# Patient Record
Sex: Male | Born: 1935 | Race: White | Hispanic: No | Marital: Married | Smoking: Never smoker
Health system: Southern US, Community
[De-identification: ages and names within clinical notes are randomized; demographics above are authoritative.]

## PROBLEM LIST (undated history)

## (undated) DIAGNOSIS — G4761 Periodic limb movement disorder: Secondary | ICD-10-CM

## (undated) DIAGNOSIS — I499 Cardiac arrhythmia, unspecified: Secondary | ICD-10-CM

## (undated) DIAGNOSIS — I10 Essential (primary) hypertension: Secondary | ICD-10-CM

## (undated) DIAGNOSIS — K219 Gastro-esophageal reflux disease without esophagitis: Secondary | ICD-10-CM

## (undated) DIAGNOSIS — G4730 Sleep apnea, unspecified: Secondary | ICD-10-CM

## (undated) DIAGNOSIS — T8859XA Other complications of anesthesia, initial encounter: Secondary | ICD-10-CM

## (undated) DIAGNOSIS — C801 Malignant (primary) neoplasm, unspecified: Secondary | ICD-10-CM

## (undated) DIAGNOSIS — T4145XA Adverse effect of unspecified anesthetic, initial encounter: Secondary | ICD-10-CM

## (undated) HISTORY — PX: PROSTATECTOMY: SHX69

## (undated) HISTORY — PX: FINGER SURGERY: SHX640

## (undated) HISTORY — PX: APPENDECTOMY: SHX54

## (undated) HISTORY — PX: CARPAL TUNNEL RELEASE: SHX101

## (undated) HISTORY — PX: COLONOSCOPY: SHX174

## (undated) HISTORY — PX: CHOLECYSTECTOMY: SHX55

## (undated) HISTORY — PX: BOWEL RESECTION: SHX1257

## (undated) HISTORY — PX: EYE SURGERY: SHX253

## (undated) HISTORY — PX: TONSILLECTOMY: SUR1361

---

## 2014-04-17 ENCOUNTER — Other Ambulatory Visit: Payer: Self-pay | Admitting: Neurosurgery

## 2014-04-17 ENCOUNTER — Encounter (HOSPITAL_COMMUNITY): Payer: Self-pay | Admitting: Pharmacy Technician

## 2014-04-20 ENCOUNTER — Inpatient Hospital Stay (HOSPITAL_COMMUNITY): Payer: Medicare Other

## 2014-04-20 ENCOUNTER — Encounter (HOSPITAL_COMMUNITY)
Admission: AD | Disposition: A | Discharge status: Home / Self Care | Payer: Self-pay | Source: Ambulatory Visit | Attending: Neurosurgery

## 2014-04-20 ENCOUNTER — Inpatient Hospital Stay (HOSPITAL_COMMUNITY)
Admission: AD | Admit: 2014-04-20 | Discharge: 2014-04-21 | DRG: 520 | Disposition: A | Discharge status: Home / Self Care | Payer: Medicare Other | Source: Ambulatory Visit | Attending: Neurosurgery | Admitting: Neurosurgery

## 2014-04-20 ENCOUNTER — Inpatient Hospital Stay (HOSPITAL_COMMUNITY): Payer: Medicare Other | Admitting: Certified Registered Nurse Anesthetist

## 2014-04-20 ENCOUNTER — Encounter (HOSPITAL_COMMUNITY): Payer: Medicare Other | Admitting: Certified Registered Nurse Anesthetist

## 2014-04-20 ENCOUNTER — Encounter (HOSPITAL_COMMUNITY): Payer: Self-pay | Admitting: *Deleted

## 2014-04-20 DIAGNOSIS — K219 Gastro-esophageal reflux disease without esophagitis: Secondary | ICD-10-CM | POA: Diagnosis present

## 2014-04-20 DIAGNOSIS — M5126 Other intervertebral disc displacement, lumbar region: Secondary | ICD-10-CM | POA: Diagnosis present

## 2014-04-20 DIAGNOSIS — I10 Essential (primary) hypertension: Secondary | ICD-10-CM | POA: Diagnosis present

## 2014-04-20 DIAGNOSIS — Z885 Allergy status to narcotic agent status: Secondary | ICD-10-CM | POA: Diagnosis not present

## 2014-04-20 DIAGNOSIS — G4730 Sleep apnea, unspecified: Secondary | ICD-10-CM | POA: Diagnosis present

## 2014-04-20 HISTORY — DX: Essential (primary) hypertension: I10

## 2014-04-20 HISTORY — DX: Malignant (primary) neoplasm, unspecified: C80.1

## 2014-04-20 HISTORY — DX: Cardiac arrhythmia, unspecified: I49.9

## 2014-04-20 HISTORY — PX: LUMBAR LAMINECTOMY/DECOMPRESSION MICRODISCECTOMY: SHX5026

## 2014-04-20 HISTORY — DX: Sleep apnea, unspecified: G47.30

## 2014-04-20 HISTORY — DX: Gastro-esophageal reflux disease without esophagitis: K21.9

## 2014-04-20 HISTORY — DX: Adverse effect of unspecified anesthetic, initial encounter: T41.45XA

## 2014-04-20 HISTORY — DX: Other complications of anesthesia, initial encounter: T88.59XA

## 2014-04-20 LAB — BASIC METABOLIC PANEL
Anion gap: 12 (ref 5–15)
BUN: 34 mg/dL — ABNORMAL HIGH (ref 6–23)
CO2: 25 meq/L (ref 19–32)
Calcium: 8.9 mg/dL (ref 8.4–10.5)
Chloride: 98 mEq/L (ref 96–112)
Creatinine, Ser: 1.2 mg/dL (ref 0.50–1.35)
GFR calc Af Amer: 66 mL/min — ABNORMAL LOW (ref >90)
GFR calc non Af Amer: 57 mL/min — ABNORMAL LOW (ref >90)
GLUCOSE: 98 mg/dL (ref 70–99)
Potassium: 4.2 mEq/L (ref 3.7–5.3)
Sodium: 135 mEq/L — ABNORMAL LOW (ref 137–147)

## 2014-04-20 LAB — CBC
HEMATOCRIT: 41.5 % (ref 39.0–52.0)
HEMOGLOBIN: 14.1 g/dL (ref 13.0–17.0)
MCH: 30.5 pg (ref 26.0–34.0)
MCHC: 34 g/dL (ref 30.0–36.0)
MCV: 89.8 fL (ref 78.0–100.0)
Platelets: 187 10*3/uL (ref 150–400)
RBC: 4.62 MIL/uL (ref 4.22–5.81)
RDW: 13.5 % (ref 11.5–15.5)
WBC: 12 10*3/uL — AB (ref 4.0–10.5)

## 2014-04-20 LAB — SURGICAL PCR SCREEN
MRSA, PCR: NEGATIVE
Staphylococcus aureus: NEGATIVE

## 2014-04-20 SURGERY — LUMBAR LAMINECTOMY/DECOMPRESSION MICRODISCECTOMY 1 LEVEL
Anesthesia: General | Laterality: Right

## 2014-04-20 MED ORDER — MENTHOL 3 MG MT LOZG: 1.0000 | LOZENGE | OROMUCOSAL | Status: DC | PRN

## 2014-04-20 MED ORDER — FENTANYL CITRATE 0.05 MG/ML IJ SOLN
INTRAMUSCULAR | Status: DC | PRN
  Administered 2014-04-20 (×5): 50 ug via INTRAVENOUS

## 2014-04-20 MED ORDER — MUPIROCIN 2 % EX OINT: 1.0000 "application " | TOPICAL_OINTMENT | Freq: Once | CUTANEOUS | Status: DC

## 2014-04-20 MED ORDER — ONDANSETRON HCL 4 MG/2ML IJ SOLN
INTRAMUSCULAR | Status: AC
  Filled 2014-04-20: qty 2

## 2014-04-20 MED ORDER — SODIUM CHLORIDE 0.9 % IJ SOLN: 3.0000 mL | INTRAMUSCULAR | Status: DC | PRN

## 2014-04-20 MED ORDER — MORPHINE SULFATE 2 MG/ML IJ SOLN
1.0000 mg | INTRAMUSCULAR | Status: DC | PRN
  Administered 2014-04-20: 2 mg via INTRAVENOUS

## 2014-04-20 MED ORDER — DIAZEPAM 5 MG PO TABS
5.0000 mg | ORAL_TABLET | Freq: Four times a day (QID) | ORAL | Status: DC | PRN
  Administered 2014-04-20: 5 mg via ORAL
  Filled 2014-04-20: qty 1

## 2014-04-20 MED ORDER — DEXAMETHASONE 4 MG PO TABS
4.0000 mg | ORAL_TABLET | Freq: Four times a day (QID) | ORAL | Status: DC
  Administered 2014-04-21: 4 mg via ORAL
  Filled 2014-04-20: qty 1

## 2014-04-20 MED ORDER — OXYCODONE HCL 5 MG/5ML PO SOLN: 5.0000 mg | Freq: Once | ORAL | Status: DC | PRN

## 2014-04-20 MED ORDER — HYDROMORPHONE HCL PF 1 MG/ML IJ SOLN: 0.2500 mg | INTRAMUSCULAR | Status: DC | PRN

## 2014-04-20 MED ORDER — MIDAZOLAM HCL 2 MG/2ML IJ SOLN
INTRAMUSCULAR | Status: AC
  Filled 2014-04-20: qty 2

## 2014-04-20 MED ORDER — SODIUM CHLORIDE 0.9 % IV SOLN: 250.0000 mL | INTRAVENOUS | Status: DC

## 2014-04-20 MED ORDER — OXYCODONE HCL 5 MG PO TABS: 5.0000 mg | ORAL_TABLET | Freq: Once | ORAL | Status: DC | PRN

## 2014-04-20 MED ORDER — MUPIROCIN 2 % EX OINT
TOPICAL_OINTMENT | CUTANEOUS | Status: AC
  Administered 2014-04-20: 12:00:00
  Filled 2014-04-20: qty 22

## 2014-04-20 MED ORDER — THROMBIN 5000 UNITS EX SOLR
CUTANEOUS | Status: DC | PRN
  Administered 2014-04-20 (×2): 5000 [IU] via TOPICAL

## 2014-04-20 MED ORDER — EPHEDRINE SULFATE 50 MG/ML IJ SOLN
INTRAMUSCULAR | Status: DC | PRN
  Administered 2014-04-20: 10 mg via INTRAVENOUS
  Administered 2014-04-20: 5 mg via INTRAVENOUS
  Administered 2014-04-20 (×4): 10 mg via INTRAVENOUS

## 2014-04-20 MED ORDER — GABAPENTIN 300 MG PO CAPS
300.0000 mg | ORAL_CAPSULE | Freq: Three times a day (TID) | ORAL | Status: DC
  Administered 2014-04-20 – 2014-04-21 (×2): 300 mg via ORAL
  Filled 2014-04-20 (×3): qty 1

## 2014-04-20 MED ORDER — ONDANSETRON HCL 4 MG/2ML IJ SOLN: 4.0000 mg | INTRAMUSCULAR | Status: DC | PRN

## 2014-04-20 MED ORDER — LISINOPRIL 40 MG PO TABS
40.0000 mg | ORAL_TABLET | Freq: Every day | ORAL | Status: DC
  Administered 2014-04-20: 40 mg via ORAL
  Filled 2014-04-20: qty 1
  Filled 2014-04-20: qty 2
  Filled 2014-04-20: qty 1

## 2014-04-20 MED ORDER — HEMOSTATIC AGENTS (NO CHARGE) OPTIME
TOPICAL | Status: DC | PRN
  Administered 2014-04-20: 1 via TOPICAL

## 2014-04-20 MED ORDER — OXYCODONE-ACETAMINOPHEN 5-325 MG PO TABS: 1.0000 | ORAL_TABLET | ORAL | Status: DC | PRN

## 2014-04-20 MED ORDER — NEOSTIGMINE METHYLSULFATE 10 MG/10ML IV SOLN
INTRAVENOUS | Status: AC
  Filled 2014-04-20: qty 1

## 2014-04-20 MED ORDER — LACTATED RINGERS IV SOLN
INTRAVENOUS | Status: DC | PRN
  Administered 2014-04-20 (×2): via INTRAVENOUS

## 2014-04-20 MED ORDER — CEFAZOLIN SODIUM-DEXTROSE 2-3 GM-% IV SOLR
INTRAVENOUS | Status: DC | PRN
  Administered 2014-04-20: 2 g via INTRAVENOUS

## 2014-04-20 MED ORDER — BUPIVACAINE-EPINEPHRINE (PF) 0.5% -1:200000 IJ SOLN
INTRAMUSCULAR | Status: DC | PRN
  Administered 2014-04-20: 20 mL via PERINEURAL

## 2014-04-20 MED ORDER — PHENOL 1.4 % MT LIQD: 1.0000 | OROMUCOSAL | Status: DC | PRN

## 2014-04-20 MED ORDER — CEFAZOLIN SODIUM 1-5 GM-% IV SOLN
1.0000 g | Freq: Three times a day (TID) | INTRAVENOUS | Status: AC
  Administered 2014-04-21 (×2): 1 g via INTRAVENOUS
  Filled 2014-04-20 (×2): qty 50

## 2014-04-20 MED ORDER — PHENYLEPHRINE HCL 10 MG/ML IJ SOLN
INTRAMUSCULAR | Status: DC | PRN
  Administered 2014-04-20 (×4): 40 ug via INTRAVENOUS

## 2014-04-20 MED ORDER — FENTANYL CITRATE 0.05 MG/ML IJ SOLN
INTRAMUSCULAR | Status: AC
  Filled 2014-04-20: qty 5

## 2014-04-20 MED ORDER — MIDAZOLAM HCL 5 MG/5ML IJ SOLN
INTRAMUSCULAR | Status: DC | PRN
  Administered 2014-04-20 (×2): 1 mg via INTRAVENOUS

## 2014-04-20 MED ORDER — 0.9 % SODIUM CHLORIDE (POUR BTL) OPTIME
TOPICAL | Status: DC | PRN
  Administered 2014-04-20: 1000 mL

## 2014-04-20 MED ORDER — BUPIVACAINE LIPOSOME 1.3 % IJ SUSP
INTRAMUSCULAR | Status: DC | PRN
  Administered 2014-04-20: 20 mL

## 2014-04-20 MED ORDER — PROPOFOL 10 MG/ML IV BOLUS
INTRAVENOUS | Status: AC
  Filled 2014-04-20: qty 20

## 2014-04-20 MED ORDER — BUPIVACAINE LIPOSOME 1.3 % IJ SUSP
20.0000 mL | Freq: Once | INTRAMUSCULAR | Status: DC
  Filled 2014-04-20: qty 20

## 2014-04-20 MED ORDER — HYDROCHLOROTHIAZIDE 25 MG PO TABS
12.5000 mg | ORAL_TABLET | Freq: Every day | ORAL | Status: DC
  Administered 2014-04-20: 12.5 mg via ORAL
  Filled 2014-04-20: qty 1

## 2014-04-20 MED ORDER — ACETAMINOPHEN 650 MG RE SUPP: 650.0000 mg | RECTAL | Status: DC | PRN

## 2014-04-20 MED ORDER — DEXAMETHASONE SODIUM PHOSPHATE 4 MG/ML IJ SOLN
INTRAMUSCULAR | Status: AC
  Filled 2014-04-20: qty 3

## 2014-04-20 MED ORDER — NEOSTIGMINE METHYLSULFATE 10 MG/10ML IV SOLN
INTRAVENOUS | Status: DC | PRN
  Administered 2014-04-20: 4 mg via INTRAVENOUS

## 2014-04-20 MED ORDER — ZOLPIDEM TARTRATE 5 MG PO TABS
5.0000 mg | ORAL_TABLET | Freq: Every evening | ORAL | Status: DC | PRN
  Administered 2014-04-21: 5 mg via ORAL
  Filled 2014-04-20: qty 1

## 2014-04-20 MED ORDER — SODIUM CHLORIDE 0.9 % IV SOLN
INTRAVENOUS | Status: DC
  Administered 2014-04-20: 1000 mL via INTRAVENOUS

## 2014-04-20 MED ORDER — LIDOCAINE HCL (CARDIAC) 20 MG/ML IV SOLN
INTRAVENOUS | Status: AC
  Filled 2014-04-20: qty 5

## 2014-04-20 MED ORDER — ROCURONIUM BROMIDE 50 MG/5ML IV SOLN
INTRAVENOUS | Status: AC
  Filled 2014-04-20: qty 1

## 2014-04-20 MED ORDER — ROCURONIUM BROMIDE 100 MG/10ML IV SOLN
INTRAVENOUS | Status: DC | PRN
  Administered 2014-04-20: 50 mg via INTRAVENOUS

## 2014-04-20 MED ORDER — FENTANYL CITRATE 0.05 MG/ML IJ SOLN
INTRAMUSCULAR | Status: DC | PRN
  Administered 2014-04-20: 100 ug via INTRAVENOUS

## 2014-04-20 MED ORDER — SODIUM CHLORIDE 0.9 % IJ SOLN: 3.0000 mL | Freq: Two times a day (BID) | INTRAMUSCULAR | Status: DC

## 2014-04-20 MED ORDER — DILTIAZEM HCL ER 180 MG PO CP24
180.0000 mg | ORAL_CAPSULE | Freq: Every day | ORAL | Status: DC
  Administered 2014-04-20: 180 mg via ORAL
  Filled 2014-04-20 (×3): qty 1

## 2014-04-20 MED ORDER — LIDOCAINE HCL (CARDIAC) 20 MG/ML IV SOLN
INTRAVENOUS | Status: DC | PRN
  Administered 2014-04-20: 50 mg via INTRAVENOUS

## 2014-04-20 MED ORDER — GLYCOPYRROLATE 0.2 MG/ML IJ SOLN
INTRAMUSCULAR | Status: AC
  Filled 2014-04-20: qty 3

## 2014-04-20 MED ORDER — ACETAMINOPHEN 325 MG PO TABS: 650.0000 mg | ORAL_TABLET | ORAL | Status: DC | PRN

## 2014-04-20 MED ORDER — FENTANYL CITRATE 0.05 MG/ML IJ SOLN
INTRAMUSCULAR | Status: AC
  Filled 2014-04-20: qty 2

## 2014-04-20 MED ORDER — PROPOFOL 10 MG/ML IV BOLUS
INTRAVENOUS | Status: DC | PRN
  Administered 2014-04-20: 120 mg via INTRAVENOUS
  Administered 2014-04-20 (×4): 20 mg via INTRAVENOUS

## 2014-04-20 MED ORDER — METHYLPREDNISOLONE ACETATE 80 MG/ML IJ SUSP
INTRAMUSCULAR | Status: DC | PRN
  Administered 2014-04-20: 80 mg

## 2014-04-20 MED ORDER — MORPHINE SULFATE 2 MG/ML IJ SOLN
INTRAMUSCULAR | Status: AC
  Filled 2014-04-20: qty 1

## 2014-04-20 MED ORDER — GLYCOPYRROLATE 0.2 MG/ML IJ SOLN
INTRAMUSCULAR | Status: DC | PRN
Start: 2014-04-20 — End: 2014-04-20
  Administered 2014-04-20: 0.2 mg via INTRAVENOUS
  Administered 2014-04-20: 0.6 mg via INTRAVENOUS

## 2014-04-20 MED ORDER — DEXAMETHASONE SODIUM PHOSPHATE 4 MG/ML IJ SOLN
4.0000 mg | Freq: Four times a day (QID) | INTRAMUSCULAR | Status: DC
  Administered 2014-04-20 – 2014-04-21 (×3): 4 mg via INTRAVENOUS
  Filled 2014-04-20 (×3): qty 1

## 2014-04-20 MED ORDER — ONDANSETRON HCL 4 MG/2ML IJ SOLN
INTRAMUSCULAR | Status: DC | PRN
  Administered 2014-04-20: 4 mg via INTRAVENOUS

## 2014-04-20 MED ORDER — DEXAMETHASONE SODIUM PHOSPHATE 4 MG/ML IJ SOLN
INTRAMUSCULAR | Status: DC | PRN
  Administered 2014-04-20: 10 mg via INTRAVENOUS

## 2014-04-20 MED ORDER — LACTATED RINGERS IV SOLN
INTRAVENOUS | Status: DC
  Administered 2014-04-20: 13:00:00 via INTRAVENOUS

## 2014-04-20 MED ORDER — ONDANSETRON HCL 4 MG/2ML IJ SOLN: 4.0000 mg | Freq: Four times a day (QID) | INTRAMUSCULAR | Status: DC | PRN

## 2014-04-20 SURGICAL SUPPLY — 58 items
BENZOIN TINCTURE PRP APPL 2/3 (GAUZE/BANDAGES/DRESSINGS) ×3 IMPLANT
BLADE SURG ROTATE 9660 (MISCELLANEOUS) IMPLANT
BUR ACORN 6.0 (BURR) ×2 IMPLANT
BUR ACORN 6.0MM (BURR) ×1
BUR MATCHSTICK NEURO 3.0 LAGG (BURR) ×3 IMPLANT
CANISTER SUCT 3000ML (MISCELLANEOUS) ×3 IMPLANT
CLOSURE WOUND 1/2 X4 (GAUZE/BANDAGES/DRESSINGS) ×1
CONT SPEC 4OZ CLIKSEAL STRL BL (MISCELLANEOUS) ×3 IMPLANT
DRAPE LAPAROTOMY 100X72X124 (DRAPES) ×3 IMPLANT
DRAPE MICROSCOPE LEICA (MISCELLANEOUS) ×3 IMPLANT
DRAPE POUCH INSTRU U-SHP 10X18 (DRAPES) ×3 IMPLANT
DRSG OPSITE POSTOP 4X6 (GAUZE/BANDAGES/DRESSINGS) ×6 IMPLANT
DURAPREP 26ML APPLICATOR (WOUND CARE) ×3 IMPLANT
ELECT BLADE 4.0 EZ CLEAN MEGAD (MISCELLANEOUS) ×3
ELECT REM PT RETURN 9FT ADLT (ELECTROSURGICAL) ×3
ELECTRODE BLDE 4.0 EZ CLN MEGD (MISCELLANEOUS) ×1 IMPLANT
ELECTRODE REM PT RTRN 9FT ADLT (ELECTROSURGICAL) ×1 IMPLANT
GAUZE SPONGE 4X4 16PLY XRAY LF (GAUZE/BANDAGES/DRESSINGS) IMPLANT
GLOVE BIOGEL M 8.0 STRL (GLOVE) ×3 IMPLANT
GLOVE BIOGEL PI IND STRL 7.5 (GLOVE) ×2 IMPLANT
GLOVE BIOGEL PI INDICATOR 7.5 (GLOVE) ×4
GLOVE ECLIPSE 6.5 STRL STRAW (GLOVE) ×3 IMPLANT
GLOVE ECLIPSE 7.5 STRL STRAW (GLOVE) ×9 IMPLANT
GLOVE EXAM NITRILE LRG STRL (GLOVE) IMPLANT
GLOVE EXAM NITRILE MD LF STRL (GLOVE) IMPLANT
GLOVE EXAM NITRILE XL STR (GLOVE) IMPLANT
GLOVE EXAM NITRILE XS STR PU (GLOVE) IMPLANT
GOWN STRL REUS W/ TWL LRG LVL3 (GOWN DISPOSABLE) ×2 IMPLANT
GOWN STRL REUS W/ TWL XL LVL3 (GOWN DISPOSABLE) ×2 IMPLANT
GOWN STRL REUS W/TWL 2XL LVL3 (GOWN DISPOSABLE) IMPLANT
GOWN STRL REUS W/TWL LRG LVL3 (GOWN DISPOSABLE) ×4
GOWN STRL REUS W/TWL XL LVL3 (GOWN DISPOSABLE) ×4
KIT BASIN OR (CUSTOM PROCEDURE TRAY) ×3 IMPLANT
KIT ROOM TURNOVER OR (KITS) ×3 IMPLANT
NEEDLE HYPO 18GX1.5 BLUNT FILL (NEEDLE) ×3 IMPLANT
NEEDLE HYPO 21X1.5 SAFETY (NEEDLE) ×3 IMPLANT
NEEDLE HYPO 25X1 1.5 SAFETY (NEEDLE) ×3 IMPLANT
NEEDLE SPNL 20GX3.5 QUINCKE YW (NEEDLE) ×3 IMPLANT
NS IRRIG 1000ML POUR BTL (IV SOLUTION) ×3 IMPLANT
PACK LAMINECTOMY NEURO (CUSTOM PROCEDURE TRAY) ×3 IMPLANT
PAD ABD 8X10 STRL (GAUZE/BANDAGES/DRESSINGS) IMPLANT
PAD ARMBOARD 7.5X6 YLW CONV (MISCELLANEOUS) ×9 IMPLANT
PATTIES SURGICAL .5 X1 (DISPOSABLE) IMPLANT
RUBBERBAND STERILE (MISCELLANEOUS) ×6 IMPLANT
SPONGE GAUZE 4X4 12PLY (GAUZE/BANDAGES/DRESSINGS) ×3 IMPLANT
SPONGE LAP 4X18 X RAY DECT (DISPOSABLE) IMPLANT
SPONGE SURGIFOAM ABS GEL SZ50 (HEMOSTASIS) ×3 IMPLANT
STRIP CLOSURE SKIN 1/2X4 (GAUZE/BANDAGES/DRESSINGS) ×2 IMPLANT
SUT VIC AB 0 CT1 18XCR BRD8 (SUTURE) ×1 IMPLANT
SUT VIC AB 0 CT1 8-18 (SUTURE) ×2
SUT VIC AB 2-0 CP2 18 (SUTURE) ×3 IMPLANT
SUT VIC AB 3-0 SH 8-18 (SUTURE) ×3 IMPLANT
SYR 20CC LL (SYRINGE) ×3 IMPLANT
SYR 20ML ECCENTRIC (SYRINGE) ×3 IMPLANT
SYR 5ML LL (SYRINGE) ×3 IMPLANT
TOWEL OR 17X24 6PK STRL BLUE (TOWEL DISPOSABLE) ×3 IMPLANT
TOWEL OR 17X26 10 PK STRL BLUE (TOWEL DISPOSABLE) ×3 IMPLANT
WATER STERILE IRR 1000ML POUR (IV SOLUTION) ×3 IMPLANT

## 2014-04-20 NOTE — Anesthesia Preprocedure Evaluation (Addendum)
Anesthesia Evaluation  Patient identified by MRN, date of birth, ID band Patient awake    Reviewed: Allergy & Precautions, H&P , NPO status , Patient's Chart, lab work & pertinent test results  History of Anesthesia Complications (+) PROLONGED EMERGENCE and history of anesthetic complications  Airway Mallampati: III TM Distance: >3 FB Neck ROM: Full    Dental  (+) Teeth Intact, Implants, Dental Advisory Given   Pulmonary sleep apnea ,          Cardiovascular hypertension, Pt. on medications + dysrhythmias     Neuro/Psych negative neurological ROS     GI/Hepatic Neg liver ROS, GERD-  ,  Endo/Other  negative endocrine ROS  Renal/GU negative Renal ROS     Musculoskeletal   Abdominal Normal abdominal exam  (+)   Peds  Hematology negative hematology ROS (+)   Anesthesia Other Findings   Reproductive/Obstetrics                          Anesthesia Physical Anesthesia Plan  ASA: II  Anesthesia Plan: General   Post-op Pain Management:    Induction: Intravenous  Airway Management Planned: Oral ETT  Additional Equipment:   Intra-op Plan:   Post-operative Plan: Extubation in OR  Informed Consent: I have reviewed the patients History and Physical, chart, labs and discussed the procedure including the risks, benefits and alternatives for the proposed anesthesia with the patient or authorized representative who has indicated his/her understanding and acceptance.   Dental advisory given  Plan Discussed with: Anesthesiologist, Surgeon and CRNA  Anesthesia Plan Comments:        Anesthesia Quick Evaluation

## 2014-04-20 NOTE — Transfer of Care (Signed)
Immediate Anesthesia Transfer of Care Note  Patient: Mark Briggs  Procedure(s) Performed: Procedure(s): Right L4-5 Extraforaminal Discectomy (Right)  Patient Location: PACU  Anesthesia Type:General  Level of Consciousness: patient cooperative and responds to stimulation  Airway & Oxygen Therapy: Patient Spontanous Breathing and Patient connected to nasal cannula oxygen  Post-op Assessment: Report given to PACU RN, Post -op Vital signs reviewed and stable and Patient moving all extremities X 4  Post vital signs: Reviewed and stable  Complications: No apparent anesthesia complications

## 2014-04-20 NOTE — Progress Notes (Signed)
Pt admitted to 4N25 from PACU.  A&O; VSS; Drsg stain marked; Pain 3/10; medicated.  Family at bedside.  Will continue to monitor.

## 2014-04-20 NOTE — H&P (Signed)
Mark Briggs is an 78 y.o. male.   Chief Complaint: right leg pain HPI: patient who came to my office with his wife ,using crutches, complaining of lumbar pain with radiation to the right leg, no better with conservative treatment. No left leg pain. He has had epidural injections with negative results.  Past medical history Past surgical history, prostatectomy, removal of small  intestine, cholecistectomy, tonsillectomy, catarat surgery No family history on file. Social History:  has no tobacco, alcohol, and drug history on file.  Allergies:  Allergies  Allergen Reactions  . Codeine Other (See Comments)    dizziness    No prescriptions prior to admission    No results found for this or any previous visit (from the past 48 hour(s)). No results found.  Review of Systems  Constitutional: Negative.   HENT:       Ringing in ears  Eyes: Negative.   Cardiovascular: Negative.   Gastrointestinal: Negative.   Musculoskeletal: Positive for back pain.  Skin: Negative.   Neurological: Positive for sensory change and focal weakness.  Endo/Heme/Allergies: Negative.   Psychiatric/Behavioral: Negative.    ringin in ears, leg pain, lumbar pain, irregular pulses.   There were no vitals taken for this visit. Physical Exam hent, nl. Neck, nl. Lungs, clear. Cv, nl. Abdomen,soft. Extremities, nl pulses. NEURO decrease os the size of the leg and thigh by 1 inche in comparison to the left. Absent knee reflex. Sensory nl but he has tingling in the left leg.mri shows extraforaminal hnp at l4-5 on the right. . ddd at l5-s1  Assessment/Plan Patient to go ahead with extraforaminal discectomy at l4-5. He eand his wife are aware of risks and benefits  Soloman Mckeithan M 04/20/2014, 11:05 AM

## 2014-04-21 NOTE — Progress Notes (Addendum)
Pt A&O x4; pt discharge education and instructions completed with pt and spouse at bedside. Both voices understanding and denies any questions. Pt IV removed; pt incision intact with no s/s infection noted. Pt discharge home with spouse to transport pt off to disposition. Pt awaiting on his home rolling walker to be delivered. Will continue to monitor pt quietly till he leaves the floor. Francis Gaines Valerya Maxton RN.

## 2014-04-21 NOTE — Discharge Summary (Signed)
Physician Discharge Summary  Patient ID: Mark Briggs MRN: [id number] DOB/AGE: 1935-11-20 78 y.o.  Admit date: 04/20/2014 Discharge date: 04/21/2014  Admission Diagnoses:right l4-5 hnp  Discharge Diagnoses:  Active Problems:   Lumbar herniated disc   Discharged Condition: no pain  Hospital Course: surgery  Consults: none  Significant Diagnostic Studies: mri  Treatments:right l4-5 extraforaminal discectomy  Discharge Exam: weakness as preop but not pain Disposition: home     Medication List    ASK your doctor about these medications       acetaminophen 500 MG tablet  Commonly known as:  TYLENOL  Take 500 mg by mouth 3 (three) times daily as needed (pain).     aspirin EC 81 MG tablet  Take 81 mg by mouth at bedtime.     diltiazem 180 MG 24 hr capsule  Commonly known as:  DILACOR XR  Take 180 mg by mouth at bedtime.     gabapentin 300 MG capsule  Commonly known as:  NEURONTIN  Take 600 mg by mouth at bedtime.     hydrochlorothiazide 12.5 MG tablet  Commonly known as:  HYDRODIURIL  Take 12.5 mg by mouth at bedtime.     ibuprofen 200 MG tablet  Commonly known as:  ADVIL,MOTRIN  Take 400 mg by mouth 3 (three) times daily as needed (pain).     lisinopril 40 MG tablet  Commonly known as:  PRINIVIL,ZESTRIL  Take 40 mg by mouth at bedtime.     zolpidem 10 MG tablet  Commonly known as:  AMBIEN  Take 5-10 mg by mouth at bedtime as needed for sleep.         Signed: Floyce Stakes 04/21/2014, 10:44 AM

## 2014-04-21 NOTE — Op Note (Signed)
Mark Briggs, Mark Briggs               ACCOUNT NO.:  [id number]  MEDICAL RECORD NO.:  [id number]  LOCATION:  [id number]                        FACILITY:  [facility]  PHYSICIAN:  Leeroy Cha, M.D.   DATE OF BIRTH:  Jan 19, 1936  DATE OF PROCEDURE:  04/20/2014 DATE OF DISCHARGE:                              OPERATIVE REPORT   PREOPERATIVE DIAGNOSIS:  Right L4-L5 extraforaminal herniated disk with atrophy of the right leg.  POSTOPERATIVE DIAGNOSIS:  Right L4-L5 extraforaminal herniated disk with atrophy of the right leg.  PROCEDURES:  Right L4-L5 extraforaminal diskectomy, removal of several fragments.  Decompression of the L4 nerve root.  Microscope.  SURGEON:  Leeroy Cha, M.D.  ASSISTANT:  Ashok Pall, M.D.  CLINICAL HISTORY:  The patient was seen by me in my office last Friday, complaining of back pain, worsened to the right leg.  Clinically, he has atrophy of the right quadriceps on the right leg.  He has failed conservative treatment.  MRI showed that he has extraforaminal disk at the level of L4-5.  Surgery was advised.  He and his wife knew the risk with the surgery including CSF leak, no improvement whatsoever, _burning_________ sensation of the nerve, needing further surgery.  PROCEDURE IN DETAIL:  The patient was taken to the OR, and after intubation, he was positioned in a prone manner.  The back was cleaned with DuraPrep.  Drapes were applied.  Midline incision from L4-L5 was made and muscle retracted all the way laterally until we were able to be close to the transverse process.  X-ray showed that we were right at the level of L4-L5.  Then, with the help of the microscope, we opened the intertransverse ligament.  Then, with the drill, we removed the lateral aspect of the facet.  What we found was that the L4 nerve root was quite attached to the area.  The patient had quite a bit of scar tissue. Dissection was carried down.  Finally, we were able to mobilize the nerve  laterally and right at the level of the axilla, there were at least 8 to 10 fragments of disk.  Decompression was done.  Hemostasis was achieved.  At the end, we had plenty of room for the nerve root. The area was irrigated.  Fentanyl and Depo-Medrol were left in the operative site and the wound was closed with Vicryl and Steri-Strips.          ______________________________ Leeroy Cha, M.D.     EB/MEDQ  D:  04/20/2014  T:  04/20/2014  Job:  [id number]

## 2014-04-21 NOTE — Anesthesia Postprocedure Evaluation (Signed)
Anesthesia Post Note  Patient: Mark Briggs  Procedure(s) Performed: Procedure(s) (LRB): Right L4-5 Extraforaminal Discectomy (Right)  Anesthesia type: General  Patient location: PACU  Post pain: Pain level controlled and Adequate analgesia  Post assessment: Post-op Vital signs reviewed, Patient's Cardiovascular Status Stable, Respiratory Function Stable, Patent Airway and Pain level controlled  Last Vitals:  Filed Vitals:   04/21/14 0552  BP: 98/54  Pulse: 78  Temp: 36.7 C  Resp: 18    Post vital signs: Reviewed and stable  Level of consciousness: awake, alert  and oriented  Complications: No apparent anesthesia complications

## 2014-04-21 NOTE — Discharge Summary (Signed)
Physician Discharge Summary  Patient ID: Mark Briggs MRN: [id number] DOB/AGE: 76-Feb-1937 76 y.o.  Admit date: 04/20/2014 Discharge date: 04/21/2014  Admission Diagnoses:lumbar disc  Discharge Diagnoses:  Active Problems:   Lumbar herniated disc   Discharged Condition:no pain    Hospital Course: surgery  Consults: none  Significant Diagnostic Studies:  mri  Treatments: lumbar discectomy  Discharge Exam: Blood pressure 121/49, pulse 85, temperature 98.8 F (37.1 C), temperature source Oral, resp. rate 18, height 5\' 11"  (1.803 m), weight 82.101 kg (181 lb), SpO2 98.00%. Walking with no pain  Disposition: home     Medication List    ASK your doctor about these medications       acetaminophen 500 MG tablet  Commonly known as:  TYLENOL  Take 500 mg by mouth 3 (three) times daily as needed (pain).     aspirin EC 81 MG tablet  Take 81 mg by mouth at bedtime.     diltiazem 180 MG 24 hr capsule  Commonly known as:  DILACOR XR  Take 180 mg by mouth at bedtime.     gabapentin 300 MG capsule  Commonly known as:  NEURONTIN  Take 600 mg by mouth at bedtime.     hydrochlorothiazide 12.5 MG tablet  Commonly known as:  HYDRODIURIL  Take 12.5 mg by mouth at bedtime.     ibuprofen 200 MG tablet  Commonly known as:  ADVIL,MOTRIN  Take 400 mg by mouth 3 (three) times daily as needed (pain).     lisinopril 40 MG tablet  Commonly known as:  PRINIVIL,ZESTRIL  Take 40 mg by mouth at bedtime.     zolpidem 10 MG tablet  Commonly known as:  AMBIEN  Take 5-10 mg by mouth at bedtime as needed for sleep.         Signed: Floyce Stakes 04/21/2014, 10:46 AM

## 2014-04-21 NOTE — Evaluation (Signed)
Physical Therapy Evaluation Patient Details Name: Trayon Krantz MRN: [id number] DOB: Jul 24, 1936 Today's Date: 04/21/2014   History of Present Illness  78 y.o. s/p Right L4-5 Extraforaminal Discectomy  Clinical Impression  Pt mobilizing very well with RW.  All education has been reinforced and completed.  No further PT needs.  Will sign off.    Follow Up Recommendations No PT follow up;Supervision for mobility/OOB    Equipment Recommendations  Rolling walker with 5" wheels    Recommendations for Other Services       Precautions / Restrictions Precautions Precautions: Back Precaution Booklet Issued: Yes (comment) Precaution Comments: Reviewed precautions with pt Restrictions Weight Bearing Restrictions: No      Mobility  Bed Mobility Overal bed mobility: Needs Assistance Bed Mobility: Rolling;Sidelying to Sit;Sit to Sidelying Rolling: Supervision Sidelying to sit: Supervision     Sit to sidelying: Supervision General bed mobility comments: cued/ demo'd for technique and practiced several times to get move proficient and safe  Transfers Overall transfer level: Needs assistance Equipment used: Rolling walker (2 wheeled) Transfers: Sit to/from [facility] Sit to Stand: Supervision Stand pivot transfers: Supervision       General transfer comment: cues for hand placement  Ambulation/Gait Ambulation/Gait assistance: Supervision Ambulation Distance (Feet): 250 Feet Assistive device: Rolling walker (2 wheeled);None Gait Pattern/deviations: Step-through pattern Gait velocity: prefers slower, but can increase speed to cue   General Gait Details: steady with RW, more guarded and antalgic without assistive device  Stairs Stairs: Yes Stairs assistance: Supervision Stair Management: One rail Right;Step to pattern;Alternating pattern;Forwards Number of Stairs: 7 General stair comments: steady with rail  Wheelchair Mobility    Modified Rankin (Stroke  Patients Only)       Balance Overall balance assessment: No apparent balance deficits (not formally assessed)                                           Pertinent Vitals/Pain R groin pain, managed by pain meds.    Home Living Family/patient expects to be discharged to:: Private residence Living Arrangements: Spouse/significant other Available Help at Discharge: Family;Available 24 hours/day Type of Home: House Home Access: Stairs to enter   [employer] of Steps: 3 Home Layout: One level;Other (Comment) Home Equipment: Cane - quad;Crutches (don't have 2 Wheeled RW like pt thought)      Prior Function Level of Independence: Independent               Hand Dominance   Dominant Hand: Right    Extremity/Trunk Assessment   Upper Extremity Assessment: Overall WFL for tasks assessed           Lower Extremity Assessment: Overall WFL for tasks assessed         Communication   Communication: No difficulties  Cognition Arousal/Alertness: Awake/alert Behavior During Therapy: WFL for tasks assessed/performed Overall Cognitive Status: Within Functional Limits for tasks assessed                      General Comments General comments (skin integrity, edema, etc.): educated/reinforced all ADVISED back prec, logroll/side to/from sit, lifting restrictions and typical activity progression.    Exercises        Assessment/Plan    PT Assessment Patent does not need any further PT services  PT Diagnosis     PT Problem List    PT Treatment Interventions  PT Goals (Current goals can be found in the Care Plan section) Acute Rehab PT Goals Patient Stated Goal: get back to doing what he was doing before PT Goal Formulation: No goals set, d/c therapy    Frequency     Barriers to discharge        Co-evaluation               End of Session   Activity Tolerance: Patient tolerated treatment well Patient left: Other  (comment);with call bell/phone within reach;with family/visitor present (sitting EOB) Nurse Communication: Mobility status         Time: 1305-1330 PT Time Calculation (min): 25 min   Charges:   PT Evaluation $Initial PT Evaluation Tier I: 1 Procedure PT Treatments $Gait Training: 8-22 mins   PT G Codes:          Damani Kelemen, Tessie Fass 04/21/2014, 1:43 PM 04/21/2014  Donnella Sham, PT [phone]  (pager)

## 2014-04-21 NOTE — Evaluation (Signed)
Occupational Therapy Evaluation Patient Details Name: Mark Briggs MRN: [id number] DOB: 1936-01-04 Today's Date: 04/21/2014    History of Present Illness 78 y.o. s/p Right L4-5 Extraforaminal Discectomy   Clinical Impression   Pt s/p above. Education provided to pt and family. No further OT needs at this time.    Follow Up Recommendations  No OT follow up;Supervision - Intermittent    Equipment Recommendations  None recommended by OT    Recommendations for Other Services       Precautions / Restrictions Precautions Precautions: Back Precaution Booklet Issued: Yes (comment) Precaution Comments: Reviewed precautions with pt Restrictions Weight Bearing Restrictions: No      Mobility Bed Mobility Overal bed mobility: Needs Assistance Bed Mobility: Rolling;Sidelying to Sit;Sit to Sidelying Rolling: Supervision Sidelying to sit: Supervision     Sit to sidelying: Supervision General bed mobility comments: cues for technique.  Transfers Overall transfer level: Needs assistance   Transfers: Sit to/from Stand Sit to Stand: Min guard         General transfer comment: cues for hand placement    Balance                                            ADL Overall ADL's : Needs assistance/impaired                     Lower Body Dressing: Min guard;Sit to/from stand   Toilet Transfer: Min guard;Ambulation;Supervision/safety;RW (bed)           Functional mobility during ADLs: Min guard;Supervision/safety;Rolling walker General ADL Comments: Educated on technique for LB ADLs and dressing technique. Told pt AE is available if needed. Educated on toilet aid if pt would need it for hygiene. Educated on use of cups for teeth care and placement of grooming items to avoid bending/twisting. Educated on use of bag on walker as well as safe shoewear. Recommended spouse be with him for shower transfer and OT went over techniques for this and discussed  what he could use for shower chair. Discussed positioning of pillows.      Vision                     Perception     Praxis      Pertinent Vitals/Pain Pain 1/10. Increased activity during session.     Hand Dominance Right   Extremity/Trunk Assessment Upper Extremity Assessment Upper Extremity Assessment: Overall WFL for tasks assessed   Lower Extremity Assessment Lower Extremity Assessment: Defer to PT evaluation       Communication Communication Communication: No difficulties   Cognition Arousal/Alertness: Awake/alert Behavior During Therapy: WFL for tasks assessed/performed Overall Cognitive Status: Within Functional Limits for tasks assessed                     General Comments       Exercises       Shoulder Instructions      Home Living Family/patient expects to be discharged to:: Private residence Living Arrangements: Spouse/significant other Available Help at Discharge: Family;Available 24 hours/day Type of Home: House Home Access: Stairs to enter [employer] of Steps: 3   Home Layout: One level;Other (Comment) (with basement)     Bathroom Shower/Tub: Tub/shower unit;Walk-in shower Shower/tub characteristics: Door (on walk in shower) Bathroom Toilet: Handicapped height (sink close)     Home Equipment: Environmental consultant -  2 wheels;Cane - quad;Crutches;Other (comment) (access to shower chair)          Prior Functioning/Environment Level of Independence: Independent             OT Diagnosis:     OT Problem List:     OT Treatment/Interventions:      OT Goals(Current goals can be found in the care plan section) Acute Rehab OT Goals Patient Stated Goal: get back to doing what he was doing before  OT Frequency:     Barriers to D/C:            Co-evaluation              End of Session Equipment Utilized During Treatment: Gait belt;Rolling walker  Activity Tolerance: Patient tolerated treatment well Patient  left: in bed;with call bell/phone within reach;with family/visitor present   Time: 6754-4920 OT Time Calculation (min): 34 min Charges:  OT General Charges $OT Visit: 1 Procedure OT Evaluation $Initial OT Evaluation Tier I: 1 Procedure OT Treatments $Self Care/Home Management : 8-22 mins G-CodesBenito Mccreedy OTR/L [phone] 04/21/2014, 10:41 AM

## 2014-04-21 NOTE — Progress Notes (Signed)
Pt DME rolling walker delivered to pt and spouse in room. Pt transported off unit via wheelchair with belongings and spouse at side. Pt discharge home.Mark Briggs.

## 2014-04-21 NOTE — Progress Notes (Signed)
[facility] called for delivery of rolling walker; B Pennie Rushing [phone]

## 2014-04-23 ENCOUNTER — Encounter (HOSPITAL_COMMUNITY): Payer: Self-pay | Admitting: Neurosurgery

## 2014-04-27 ENCOUNTER — Ambulatory Visit
Admission: RE | Admit: 2014-04-27 | Discharge: 2014-04-27 | Disposition: A | Discharge status: Home / Self Care | Payer: Medicare Other | Source: Ambulatory Visit | Attending: Neurological Surgery | Admitting: Neurological Surgery

## 2014-04-27 ENCOUNTER — Other Ambulatory Visit: Payer: Self-pay | Admitting: Neurological Surgery

## 2014-04-27 DIAGNOSIS — Z9181 History of falling: Secondary | ICD-10-CM

## 2014-04-27 MED ORDER — GADOBENATE DIMEGLUMINE 529 MG/ML IV SOLN
17.0000 mL | Freq: Once | INTRAVENOUS | Status: AC | PRN
  Administered 2014-04-27: 17 mL via INTRAVENOUS

## 2014-05-11 ENCOUNTER — Other Ambulatory Visit: Payer: Self-pay | Admitting: Neurosurgery

## 2014-05-11 DIAGNOSIS — M545 Low back pain: Secondary | ICD-10-CM

## 2014-05-11 DIAGNOSIS — M25551 Pain in right hip: Secondary | ICD-10-CM

## 2014-05-13 ENCOUNTER — Ambulatory Visit
Admission: RE | Admit: 2014-05-13 | Discharge: 2014-05-13 | Disposition: A | Discharge status: Home / Self Care | Payer: Medicare Other | Source: Ambulatory Visit | Attending: Neurosurgery | Admitting: Neurosurgery

## 2014-05-13 DIAGNOSIS — M545 Low back pain: Secondary | ICD-10-CM

## 2014-05-13 DIAGNOSIS — M25551 Pain in right hip: Secondary | ICD-10-CM

## 2014-05-13 MED ORDER — METHYLPREDNISOLONE ACETATE 40 MG/ML INJ SUSP (RADIOLOG
120.0000 mg | Freq: Once | INTRAMUSCULAR | Status: AC
Start: 2014-05-13 — End: 2014-05-13
  Administered 2014-05-13: 120 mg via EPIDURAL

## 2014-05-13 MED ORDER — IOHEXOL 180 MG/ML  SOLN
1.0000 mL | Freq: Once | INTRAMUSCULAR | Status: AC | PRN
  Administered 2014-05-13: 1 mL via EPIDURAL

## 2014-05-13 NOTE — Discharge Instructions (Signed)

## 2014-05-18 ENCOUNTER — Other Ambulatory Visit: Payer: Self-pay | Admitting: Neurosurgery

## 2014-05-22 ENCOUNTER — Other Ambulatory Visit: Payer: Self-pay | Admitting: Neurosurgery

## 2014-05-22 DIAGNOSIS — R1900 Intra-abdominal and pelvic swelling, mass and lump, unspecified site: Secondary | ICD-10-CM

## 2014-05-26 ENCOUNTER — Other Ambulatory Visit: Payer: Self-pay | Admitting: Orthopedic Surgery

## 2014-05-27 ENCOUNTER — Ambulatory Visit
Admission: RE | Admit: 2014-05-27 | Discharge: 2014-05-27 | Disposition: A | Discharge status: Home / Self Care | Payer: Medicare Other | Source: Ambulatory Visit | Attending: Neurosurgery | Admitting: Neurosurgery

## 2014-05-27 DIAGNOSIS — R1900 Intra-abdominal and pelvic swelling, mass and lump, unspecified site: Secondary | ICD-10-CM

## 2014-05-27 MED ORDER — IOHEXOL 300 MG/ML  SOLN
100.0000 mL | Freq: Once | INTRAMUSCULAR | Status: AC | PRN
  Administered 2014-05-27: 100 mL via INTRAVENOUS

## 2014-07-02 ENCOUNTER — Other Ambulatory Visit: Payer: Self-pay | Admitting: Neurosurgery

## 2014-07-02 DIAGNOSIS — M5126 Other intervertebral disc displacement, lumbar region: Secondary | ICD-10-CM

## 2014-07-08 ENCOUNTER — Ambulatory Visit
Admission: RE | Admit: 2014-07-08 | Discharge: 2014-07-08 | Disposition: A | Discharge status: Home / Self Care | Payer: Medicare Other | Source: Ambulatory Visit | Attending: Neurosurgery | Admitting: Neurosurgery

## 2014-07-08 VITALS — BP 93/57 | HR 69

## 2014-07-08 DIAGNOSIS — M5126 Other intervertebral disc displacement, lumbar region: Secondary | ICD-10-CM

## 2014-07-08 MED ORDER — IOHEXOL 180 MG/ML  SOLN
20.0000 mL | Freq: Once | INTRAMUSCULAR | Status: AC | PRN
  Administered 2014-07-08: 20 mL via INTRATHECAL

## 2014-07-08 MED ORDER — DIAZEPAM 5 MG PO TABS
5.0000 mg | ORAL_TABLET | Freq: Once | ORAL | Status: AC
  Administered 2014-07-08: 5 mg via ORAL

## 2014-07-08 NOTE — Discharge Instructions (Signed)

## 2015-09-06 ENCOUNTER — Other Ambulatory Visit: Payer: Self-pay | Admitting: Neurosurgery

## 2015-09-06 DIAGNOSIS — M5416 Radiculopathy, lumbar region: Secondary | ICD-10-CM

## 2015-09-17 ENCOUNTER — Ambulatory Visit
Admission: RE | Admit: 2015-09-17 | Discharge: 2015-09-17 | Disposition: A | Discharge status: Home / Self Care | Payer: Medicare Other | Source: Ambulatory Visit | Attending: Neurosurgery | Admitting: Neurosurgery

## 2015-09-17 VITALS — BP 147/69 | HR 59

## 2015-09-17 DIAGNOSIS — M5126 Other intervertebral disc displacement, lumbar region: Secondary | ICD-10-CM

## 2015-09-17 DIAGNOSIS — M5416 Radiculopathy, lumbar region: Secondary | ICD-10-CM

## 2015-09-17 MED ORDER — DIAZEPAM 5 MG PO TABS
5.0000 mg | ORAL_TABLET | Freq: Once | ORAL | Status: AC
  Administered 2015-09-17: 5 mg via ORAL

## 2015-09-17 MED ORDER — IOHEXOL 180 MG/ML  SOLN
15.0000 mL | Freq: Once | INTRAMUSCULAR | Status: AC | PRN
  Administered 2015-09-17: 15 mL via INTRATHECAL

## 2015-09-17 NOTE — Discharge Instructions (Signed)

## 2016-07-07 IMAGING — CR DG MYELOGRAPHY LUMBAR INJ LUMBOSACRAL
4 series · 4 of 4 positions shown · non-contrast
Comparison: CT myelogram 07/08/2014.

CLINICAL DATA: Low back pain. RIGHT leg pain. Some residual
numbness and weakness status post lumbar decompression.
TECHNIQUE: Contiguous axial images were obtained through the Lumbar spine after
the intrathecal infusion of infusion. Coronal and sagittal
reconstructions were obtained of the axial image sets.

[w lumbar spine ap]
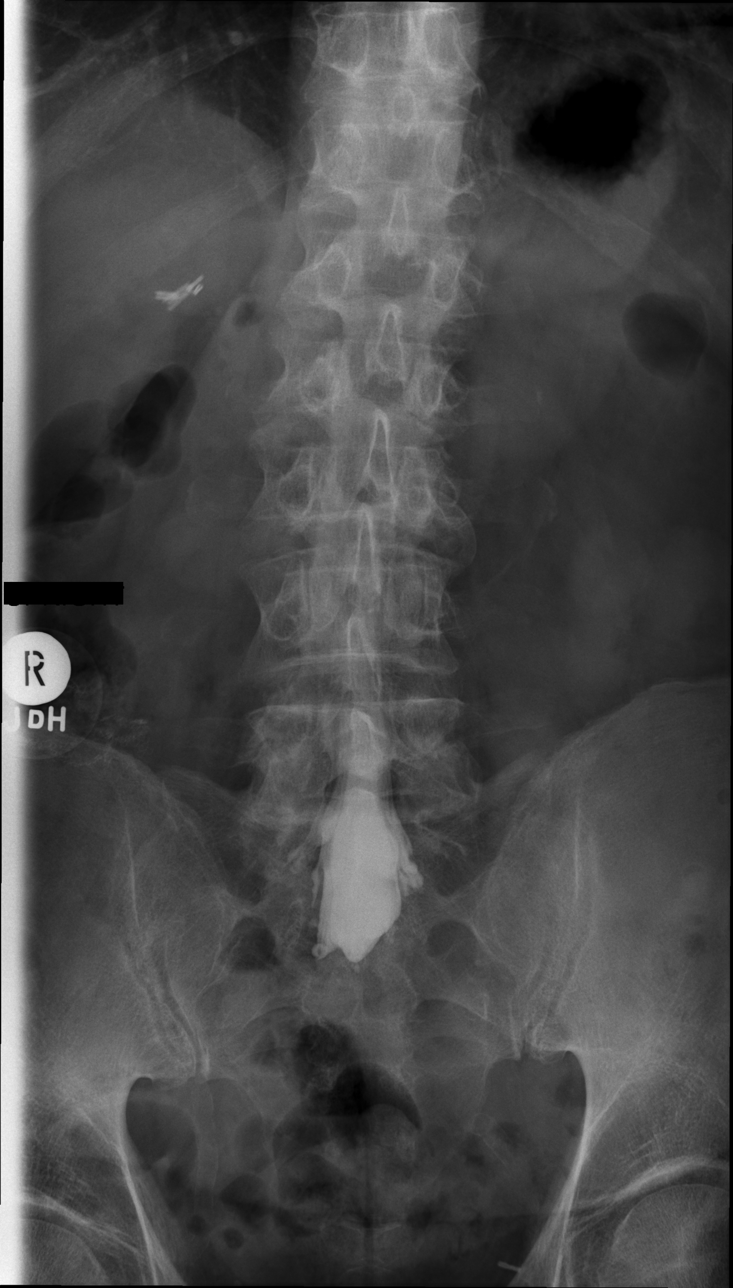

[w lumbar spine lat]
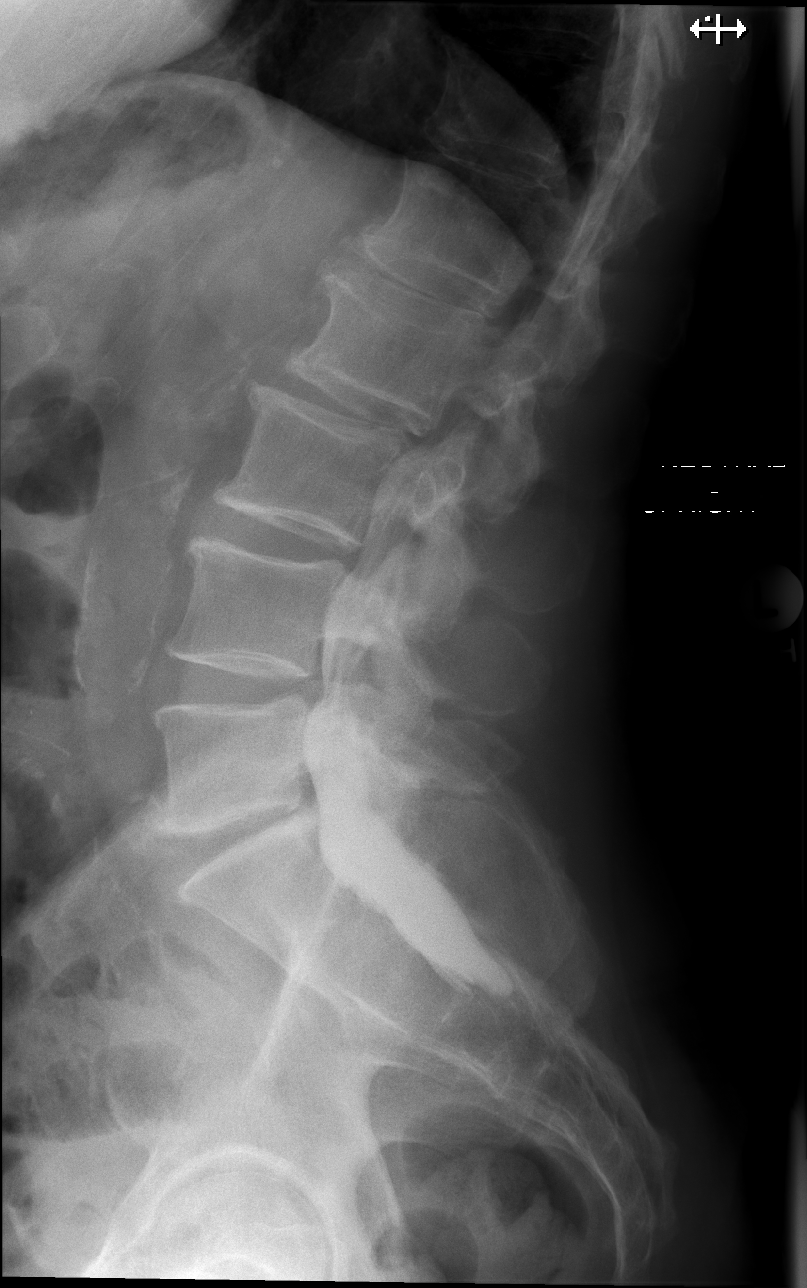

[w lumbar spine flexion]
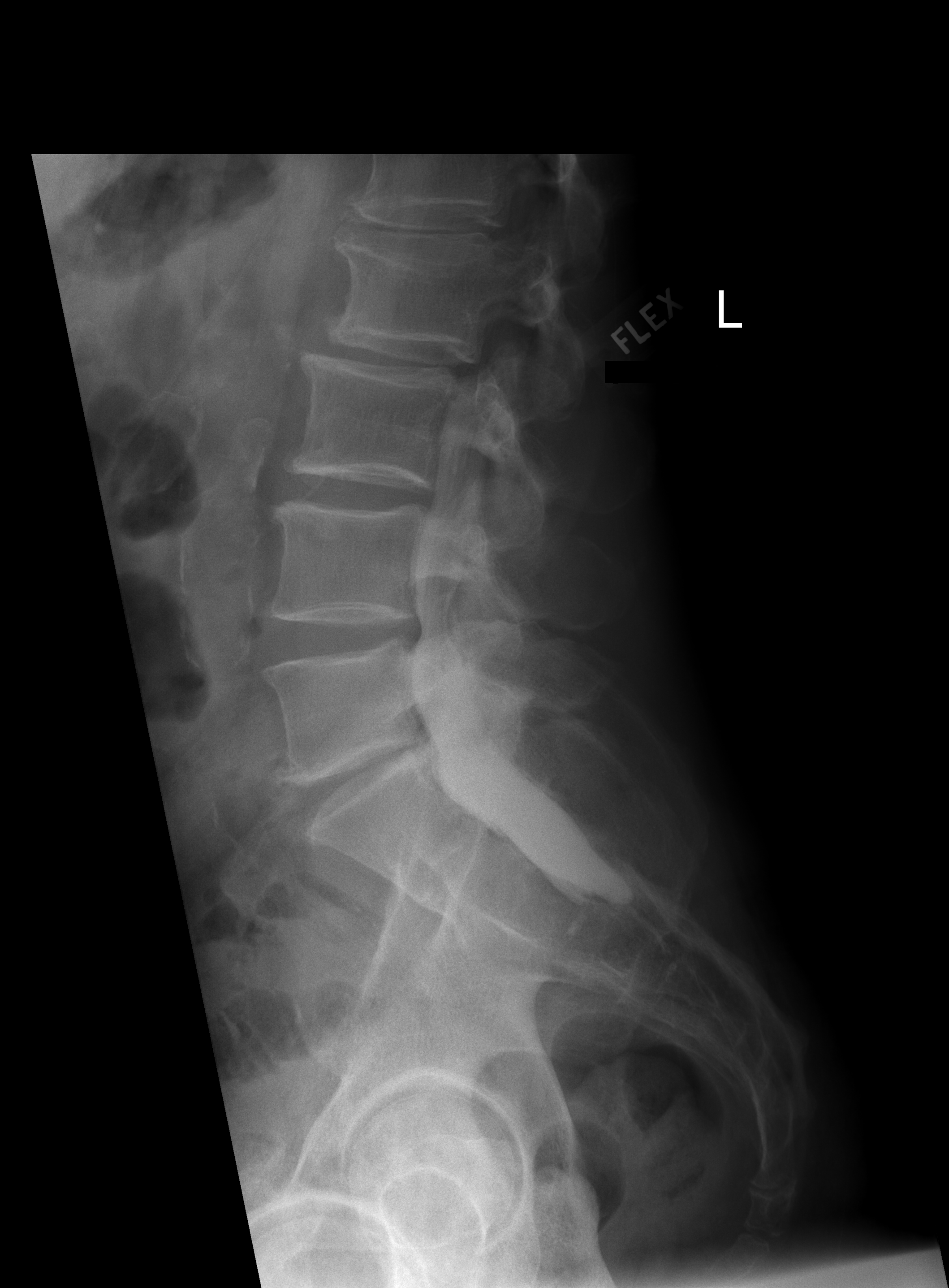

[w lumbar spine extension]
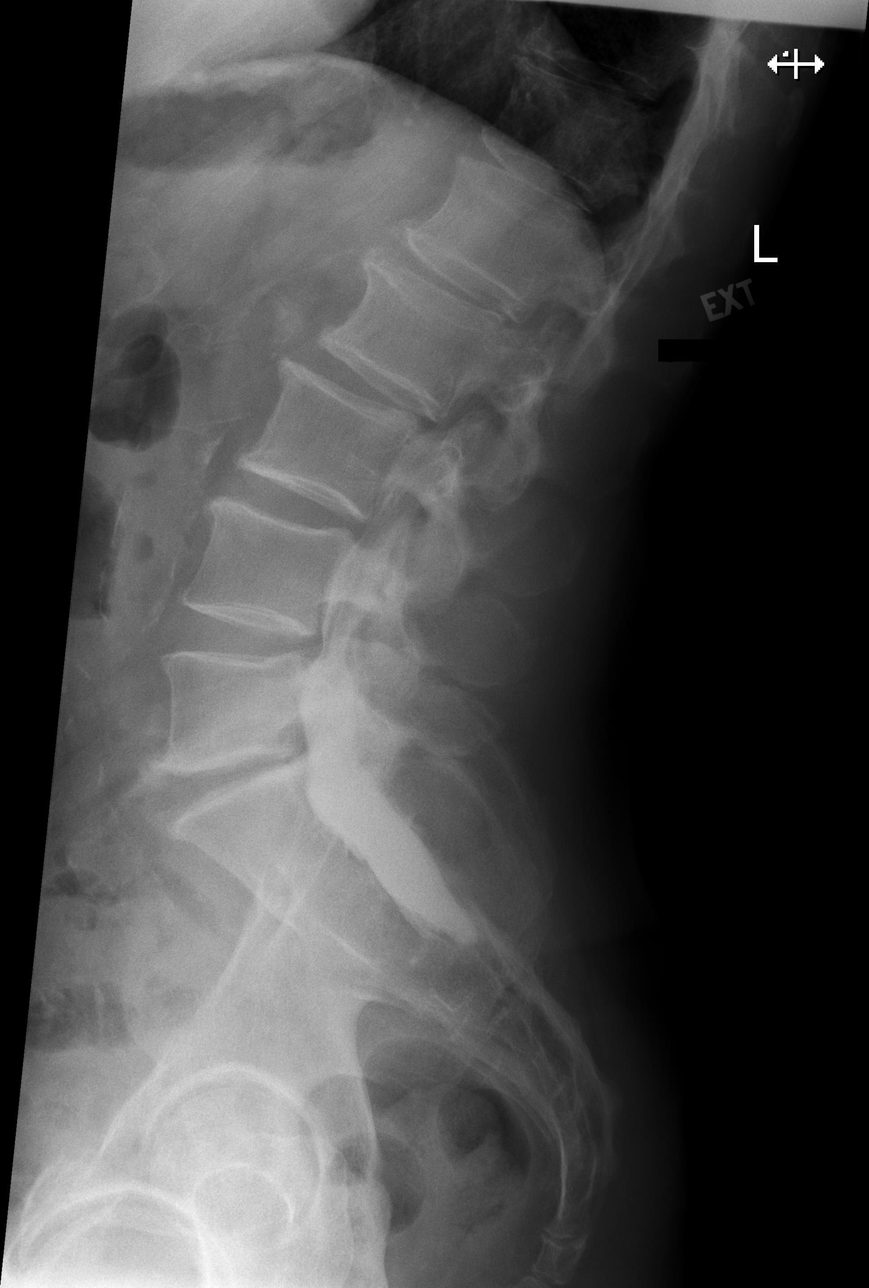

[4 of 4 positions shown; findings below may reference images not displayed]

EXAM:
LUMBAR MYELOGRAM

FLUOROSCOPY TIME:  57 seconds.  Fifteen spot films.

PROCEDURE:
After thorough discussion of risks and benefits of the procedure
including bleeding, infection, injury to nerves, blood vessels,
adjacent structures as well as headache and CSF leak, written and
oral informed consent was obtained. Consent was obtained by Dr. Ferienhaus
Rockwell. Time out form was completed.

Patient was positioned prone on the fluoroscopy table. Local
anesthesia was provided with 1% lidocaine without epinephrine after
prepped and draped in the usual sterile fashion. Puncture was
performed at L3-L4 using a 3 1/2 inch 22-gauge spinal needle via
midline approach. Using a single pass through the dura, the needle
was placed within the thecal sac, with return of clear CSF. 15 mL of
Isovue M 200 was injected into the thecal sac, with normal
opacification of the nerve roots and cauda equina consistent with
free flow within the subarachnoid space.

I personally performed the lumbar puncture and administered the
intrathecal contrast. I also personally supervised acquisition of
the myelogram images.
FINDINGS: LUMBAR MYELOGRAM FINDINGS:

Good opacification lumbar subarachnoid space. Advanced disc space
narrowing L5-S1. Trace anterolisthesis at this level. Mild annular
bulging L4-5. 4 mm retrolisthesis L2-L3. No nerve root cut off or
spinal stenosis.

Standing flexion extension demonstrates unchanged appearance at
L2-L3. 4 mm retrolisthesis does not significantly change between
standing flexion extension. L5-S1 level does normalize with regard
to trace anterolisthesis.

With patient standing, there does appear to be mild stenosis at
L4-5. This effaces, but does not completely truncated, the L5 nerve
roots. There is a prominent mass effect dorsally from ligamentum
flavum hypertrophy and infolding, as well as increased prominence of
the ventral midline structures at L4-5 representing protruding disc
material.

CT LUMBAR MYELOGRAM FINDINGS:

Segmentation: Normal.

Alignment:  Normal except for trace retrolisthesis L2-3.

Vertebrae: No worrisome osseous lesion.

Conus medullaris: Normal in size and location.

Paraspinal tissues: No evidence for hydronephrosis. Renal cystic
disease appears unchanged. Cystic lesion inferomedial to the LEFT
kidney 20 x 17 mm cross-section, water density, unchanged from prior
myelogram MR [id number].

Disc levels:

L1-L2:  Normal.

L2-L3: Mild stenosis is noncompressive, related to trace
retrolisthesis annular bulging. Mild foraminal zone and subarticular
zone narrowing does not appear to affect the L2 or L3 nerve roots.

L3-L4: Mild bulge. Borderline stenosis. Anterior osseous spurring.
No impingement.

L4-L5: Postsurgical changes on the RIGHT status post extraforaminal
discectomy. Disarticulation RIGHT transverse process. Ligamentum
flavum infolding. Broad-based central protrusion. RIGHT greater than
LEFT subarticular zone narrowing could affect the RIGHT L5 nerve
root. RIGHT L4 nerve root does not appear displaced in the foraminal
or extraforaminal zone. The area of previous postsurgical
inflammation on myelogram of 07/08/2014 has significantly regressed
in the RIGHT extraforaminal space.

L5-S1: Disc space narrowing. Calcified central protrusion. BILATERAL
facet arthropathy. RIGHT greater than LEFT foraminal zone narrowing
is multifactorial, related to loss of interspace height, osseous
spurring, and disc material. RIGHT greater than LEFT L5 nerve root
impingement is observed. No S1 nerve root impingement in the canal.
IMPRESSION: LUMBAR MYELOGRAM IMPRESSION:

Relatively minor changes of spondylosis on lumbar myelography. Mild
dynamic instability at L2-3 does not result in significant stenosis
or neural impingement.

No areas of RIGHT-sided nerve root cut off or postsurgical
complication at the L4-5 level.

CT LUMBAR MYELOGRAM IMPRESSION:

Improved appearance at L4-5 on the RIGHT status post extraforaminal
exploration for disc herniation.

BILATERAL foraminal narrowing at L5-S1, worse on the RIGHT, is
multifactorial, related to disc space narrowing, facet disease,
osseous spurring, and disc material. Symptomatic RIGHT L5 nerve root
impingement is possible.

## 2018-10-15 ENCOUNTER — Other Ambulatory Visit: Payer: Self-pay | Admitting: Neurosurgery

## 2018-10-18 ENCOUNTER — Encounter (HOSPITAL_COMMUNITY)
Admission: RE | Admit: 2018-10-18 | Discharge: 2018-10-18 | Disposition: A | Discharge status: Home / Self Care | Payer: Medicare Other | Source: Ambulatory Visit | Attending: Neurosurgery | Admitting: Neurosurgery

## 2018-10-18 ENCOUNTER — Encounter (HOSPITAL_COMMUNITY): Payer: Self-pay

## 2018-10-18 ENCOUNTER — Other Ambulatory Visit: Payer: Self-pay

## 2018-10-18 DIAGNOSIS — Z85828 Personal history of other malignant neoplasm of skin: Secondary | ICD-10-CM | POA: Diagnosis not present

## 2018-10-18 DIAGNOSIS — Z7982 Long term (current) use of aspirin: Secondary | ICD-10-CM | POA: Diagnosis not present

## 2018-10-18 DIAGNOSIS — I10 Essential (primary) hypertension: Secondary | ICD-10-CM | POA: Diagnosis not present

## 2018-10-18 DIAGNOSIS — Z01818 Encounter for other preprocedural examination: Secondary | ICD-10-CM

## 2018-10-18 DIAGNOSIS — G4761 Periodic limb movement disorder: Secondary | ICD-10-CM | POA: Diagnosis not present

## 2018-10-18 DIAGNOSIS — M5126 Other intervertebral disc displacement, lumbar region: Secondary | ICD-10-CM

## 2018-10-18 DIAGNOSIS — M5116 Intervertebral disc disorders with radiculopathy, lumbar region: Secondary | ICD-10-CM | POA: Diagnosis present

## 2018-10-18 DIAGNOSIS — G4730 Sleep apnea, unspecified: Secondary | ICD-10-CM | POA: Diagnosis not present

## 2018-10-18 DIAGNOSIS — Z79899 Other long term (current) drug therapy: Secondary | ICD-10-CM | POA: Diagnosis not present

## 2018-10-18 HISTORY — DX: Periodic limb movement disorder: G47.61

## 2018-10-18 LAB — CBC
HCT: 43.3 % (ref 39.0–52.0)
HEMOGLOBIN: 14.1 g/dL (ref 13.0–17.0)
MCH: 29.6 pg (ref 26.0–34.0)
MCHC: 32.6 g/dL (ref 30.0–36.0)
MCV: 91 fL (ref 80.0–100.0)
Platelets: 166 10*3/uL (ref 150–400)
RBC: 4.76 MIL/uL (ref 4.22–5.81)
RDW: 13 % (ref 11.5–15.5)
WBC: 7.7 10*3/uL (ref 4.0–10.5)
nRBC: 0 % (ref 0.0–0.2)

## 2018-10-18 LAB — SURGICAL PCR SCREEN
MRSA, PCR: NEGATIVE
Staphylococcus aureus: NEGATIVE

## 2018-10-18 NOTE — Pre-Procedure Instructions (Addendum)
Mark Briggs  10/18/2018     Your procedure is scheduled on Monday, February 3.  Report to [facility] Admitting at 2:00 PM               Your surgery or procedure is scheduled for 4:00 PM   Call this number if you have problems the morning of surgery: [phone]  This is the number for the Pre- Surgical Desk.    Remember:  Do not eat or drink after midnight Sunday, February 2.   Take these medicines the morning of surgery with A SIP OF WATER :                diltiazem (DILACOR XR)                DULoxetine (CYMBALTA)   omeprazole (PRILOSEC)                 Take if needed :                acetaminophen (TYLENOL)                 Aspirin : follow your surgeons instructions                              STOP taking Aspirin Products (Goody Powder, Excedrin Migraine), Ibuprofen (Advil), Naproxen (Aleve), Vitamins and Herbal Products (ie Fish Oil).               [name]- Preparing For Surgery  Before surgery, you can play an important role. Because skin is not sterile, your skin needs to be as free of germs as possible. You can reduce the number of germs on your skin by washing with CHG (chlorahexidine gluconate) Soap before surgery.  CHG is an antiseptic cleaner which kills germs and bonds with the skin to continue killing germs even after washing.    Oral Hygiene is also important to reduce your risk of infection.  Remember - BRUSH YOUR TEETH THE MORNING OF SURGERY WITH YOUR REGULAR TOOTHPASTE  Please do not use if you have an allergy to CHG or antibacterial soaps. If your skin becomes reddened/irritated stop using the CHG.  Do not shave (including legs and underarms) for at least 48 hours prior to first CHG shower. It is OK to shave your face.  Please follow these instructions carefully.   1. Shower the NIGHT BEFORE SURGERY and the MORNING OF SURGERY with CHG.   2. If you chose to wash your hair, wash your hair first as usual with your normal  shampoo.  3. After you shampoo,wash your face and private area with the soap you use at home, then rinse your hair and body thoroughly to remove the shampoo and soap.  .  4. Use CHG as you would any other liquid soap. You can apply CHG directly to the skin and wash gently with a scrungie or a clean washcloth.   5. Apply the CHG Soap to your body ONLY FROM THE NECK DOWN.  Do not use on open wounds or open sores. Avoid contact with your eyes, ears, mouth and genitals (private parts).   6. Wash thoroughly, paying special attention to the area where your surgery will be performed.  7. Thoroughly rinse your body with warm water from the neck down.  8. DO NOT shower/wash with your normal soap after using and rinsing off the CHG Soap.  9. Pat yourself dry with a CLEAN TOWEL.  10. Wear CLEAN PAJAMAS to bed the night before surgery, wear comfortable clothes the morning of surgery  11. Place CLEAN SHEETS on your bed the night of your first shower and DO NOT SLEEP WITH PETS.  Day of Surgery:  Shower as written above Please wear clean clothes to the hospital/surgery center.   Remember to brush your teeth WITH YOUR REGULAR TOOTHPASTE  Do not wear jewelry, make-up or nail polish.  Do not wear lotions, powders, or perfumes, or deodorant.  Men may shave face and neck.  Do not bring valuables to the hospital.  [facility] is not responsible for any belongings or valuables.  Contacts, dentures or bridgework may not be worn into surgery.  Leave your suitcase in the car.  After surgery it may be brought to your room.  For patients admitted to the hospital, discharge time will be determined by your treatment team.  Please read over the following fact sheets that you were given: Pain Booklet, Coughing and Deep Breathing, Surgical Site Infections.

## 2018-10-18 NOTE — Progress Notes (Signed)
PCP - Dr Junita Push  Cardiologist - Dr Jeneen Rinks McGukin  Chest x-ray -   EKG -   Stress Test - no  ECHO - 10/08/2018  Cardiac Cath - no  24 hour Heart Monitor 10/08/2018  Sleep Study - needs- per cardiologist- with work on after surgery  Had one 06/03/2013  CPAP - no  LABS- BMP 121/2020, CBC 10/18/2018  ASA- stopped 10/14/2018  HA1C- NA Fasting Blood Sugar - NA Checks Blood Sugar ____NA_ times a day  Anesthesia- Follow up- FYI cardiac clearance  Pt denies having chest pain, sob, or fever at this time. All instructions explained to the pt, with a verbal understanding of the material. Pt agrees to go over the instructions while at home for a better understanding. The opportunity to ask questions was provided.

## 2018-10-18 NOTE — Anesthesia Preprocedure Evaluation (Addendum)
Anesthesia Evaluation  Patient identified by MRN, date of birth, ID band Patient awake    Reviewed: Allergy & Precautions, NPO status , Patient's Chart, lab work & pertinent test results  History of Anesthesia Complications Negative for: history of anesthetic complications  Airway Mallampati: II  TM Distance: >3 FB Neck ROM: Full    Dental  (+) Dental Advisory Given   Pulmonary sleep apnea ,    breath sounds clear to auscultation       Cardiovascular hypertension, Pt. on medications + dysrhythmias  Rhythm:Regular     Neuro/Psych negative neurological ROS  negative psych ROS   GI/Hepatic Neg liver ROS, GERD  Medicated and Controlled,  Endo/Other  negative endocrine ROS  Renal/GU negative Renal ROS     Musculoskeletal negative musculoskeletal ROS (+)   Abdominal   Peds  Hematology negative hematology ROS (+)   Anesthesia Other Findings Echo 10/08/18: Summary No prior study There is mild aortic sclerosis noted, with no evidence of stenosis. Mild aortic regurgitation. Tricuspid valve is structurally normal. Mild tricuspid regurgitation. RVSP 28 mm Hg. Ejection fraction is visually estimated at 40-45% Basal anterolateral wall segments were not clearly visualized. Diastolic function Cannot be assessed EF is difficult to evaluate due to very frequent PVC's  Reproductive/Obstetrics                           Anesthesia Physical Anesthesia Plan  ASA: II  Anesthesia Plan: General   Post-op Pain Management:    Induction: Intravenous  PONV Risk Score and Plan: 2 and Ondansetron and Dexamethasone  Airway Management Planned: Oral ETT  Additional Equipment: None  Intra-op Plan:   Post-operative Plan: Extubation in OR  Informed Consent: I have reviewed the patients History and Physical, chart, labs and discussed the procedure including the risks, benefits and alternatives for the  proposed anesthesia with the patient or authorized representative who has indicated his/her understanding and acceptance.     Dental advisory given  Plan Discussed with: CRNA and Surgeon  Anesthesia Plan Comments: (PAT note written 10/18/2018 by Myra Gianotti, PA-C. )       Anesthesia Quick Evaluation

## 2018-10-18 NOTE — Progress Notes (Signed)
Anesthesia Chart Review:  Case:  [id number] Date/Time:  10/21/18 1549   Procedure:  Microdiscectomy - L4-L5 - right Redo (Right Back)   Anesthesia type:  General   Pre-op diagnosis:  HNP   Location:  [facility] OR ROOM 21 / [place] [facility]   Surgeon:  Kary Kos, MD      DISCUSSION: Patient is a 83 year old male scheduled for the above procedure. Apparently, surgery was initially scheduled ~ 10/08/18 but when he presented for surgery anesthesia canceled the case due to frequent PVCs. He also had a left BBB which as reported old. He was referred to cardiology and seen by Mathis Bud, MD on 10/08/18 with echocardiogram and 24 hour monitor ordered (results outlined below). He was seen for follow-up and preoperative evaluation by Dr. Beatrix Fetters on 10/15/18 (see [place]).  for preoperative evaluation following testing (echo and 24 hour monitor). He wrote:  "His ejection fraction is somewhat lower than his last evaluation which was over a decade ago. Is also difficult to know whether this is accurate or not due to the amount of PVCs he was having during the echo. From a surgical perspective he should be considered cleared for the intended surgery. I will follow him up after the surgery is complete. I did tell him that I think that he needs to be seen by our electrophysiologist since he does have a chronic long-term left bundle branch block and his LV function appears to be lower than what it has been before. I did go over his monitor findings and the fact that he did have 1 short run of AI VR which was during the middle of the night. He has been told in the distant past that he probably has sleep apnea however never acted upon this. I think that he needs an evaluation for his obstructive sleep apnea and possible treatment."  Other history includes never smoker, HTN, OSA (no CPAP), GERD, cancer (skin, prostate s/p prostatectomy), periodic limb movement disorder.   Anesthesiologist to evaluate on the day of surgery. He  is on Cardizem.   VS: BP (!) 145/77   Pulse 76   Temp 36.6 C (Oral)   Resp 20   Ht 5' 10"  (1.778 m)   Wt 83.1 kg   SpO2 100%   BMI 26.28 kg/m   PROVIDERS: Reita Cliche, MD is PCP  Mathis Bud, MD is cardiologist   LABS: Preoperative CBC WNL. He also had a BMET on 10/08/18 ([place]) that showed Na 140, K 4.2, Cl 103, CO2 34, BUN 36, Cr 1.17, glucose 94, Ca 8.6, anion gap 3, eGFR 58 with magnesium of 2.3.  (all labs ordered are listed, but only abnormal results are displayed)  Labs Reviewed  SURGICAL PCR SCREEN  CBC    IMAGES: MRI L-spine 10/01/18 ([place]): IMPRESSION: - Postoperative change on the right at L4-5. The patient has a new, large and down turning disc extrusion in the right subarticular recess which impinges on the descending right L5 root. The disc fragment may be sequestered. There is mild to moderate central canal stenosis overall at L4-5. - Except as described above, the appearance of the lumbar spine is unchanged.   EKG: 10/08/28 [facility], Dr. Beatrix Fetters): SR, first degree AV bloc with frequent and consecutive PVCs. LAD. Left BBB. PVCs are new when compared to tracing from 04/20/14.    CV: Echo 10/08/18: Summary No prior study There is mild aortic sclerosis noted, with no evidence of stenosis. Mild aortic  regurgitation. Tricuspid valve is structurally normal. Mild tricuspid regurgitation. RVSP 28 mm Hg. Ejection fraction is visually estimated at 40-45% Basal anterolateral wall segments were not clearly visualized. Diastolic function Cannot be assessed EF is difficult to evaluate due to very frequent PVC's  24 Hour Holter monitor 10/08/18 ([place]): Baseline Rhythm: sinus rhythm Rhythm Findings: 1. Ventricular: frequent single PVCs, couplets, triplets and ventricular tachycardia, total of 27,634 beats with 17,377 single PVCs sometimes in a bigeminy trigeminy pattern, there were 3964 couplets and 614 runs.  Most of the runs were short. The longest run was 34 beats at 3:23 AM and appeared to be more of an AI VR. 2. Supraventricular: rare single PACs, total of 7 beats 3. Bradyarrhythmias and Pauses: rare, with RR interval of 1.6 seconds 4. LOWEST HR = 54 BPM, AVERAGE HR = 74 BPM, HIGHEST HR = 110 BPM  Symptoms reported: None reported CONCLUSIONS: 1. abnormal Holter monitor.  2. Arrhythmia present as described 3. Symptoms were not reported  4. Symptoms were not correlated with arrhythmias   Past Medical History:  Diagnosis Date  . Cancer ([place])    skin cancer. Prostate  . Complication of anesthesia    difficult to awake after endo procedure 2000ish  . Dysrhythmia    at times  . GERD (gastroesophageal reflux disease)    no medication for it  . Hypertension   . Periodic limb movement disorder    severe  . Sleep apnea    mild no cpap at this time    Past Surgical History:  Procedure Laterality Date  . APPENDECTOMY    . BOWEL RESECTION    . CARPAL TUNNEL RELEASE Right   . CHOLECYSTECTOMY    . COLONOSCOPY    . EYE SURGERY Bilateral    Catarct with lens  . FINGER SURGERY Left    fluid removal  . LUMBAR LAMINECTOMY/DECOMPRESSION MICRODISCECTOMY Right 04/20/2014   Procedure: Right L4-5 Extraforaminal Discectomy;  Surgeon: Floyce Stakes, MD;  Location: [facility] NEURO ORS;  Service: Neurosurgery;  Laterality: Right;  . PROSTATECTOMY    . TONSILLECTOMY      MEDICATIONS: . acetaminophen (TYLENOL) 500 MG tablet  . aspirin EC 81 MG tablet  . cimetidine (TAGAMET) 200 MG tablet  . diltiazem (DILACOR XR) 180 MG 24 hr capsule  . DULoxetine (CYMBALTA) 60 MG capsule  . gabapentin (NEURONTIN) 300 MG capsule  . hydrochlorothiazide (MICROZIDE) 12.5 MG capsule  . ibuprofen (ADVIL,MOTRIN) 200 MG tablet  . omeprazole (PRILOSEC) 40 MG capsule  . zolpidem (AMBIEN) 10 MG tablet   No current facility-administered medications for this encounter.   Last ASA 10/14/18.   Myra Gianotti, PA-C Surgical Short Stay/Anesthesiology [facility] Phone [phone] [facility] Phone [phone] 10/18/2018 4:52 PM

## 2018-10-21 ENCOUNTER — Encounter (HOSPITAL_COMMUNITY)
Admission: RE | Disposition: A | Discharge status: Home / Self Care | Payer: Self-pay | Source: Home / Self Care | Attending: Neurosurgery

## 2018-10-21 ENCOUNTER — Ambulatory Visit (HOSPITAL_COMMUNITY): Payer: Medicare Other | Admitting: Vascular Surgery

## 2018-10-21 ENCOUNTER — Ambulatory Visit (HOSPITAL_COMMUNITY)
Admission: RE | Admit: 2018-10-21 | Discharge: 2018-10-22 | Disposition: A | Discharge status: Home / Self Care | Payer: Medicare Other | Attending: Neurosurgery | Admitting: Neurosurgery

## 2018-10-21 ENCOUNTER — Ambulatory Visit (HOSPITAL_COMMUNITY): Payer: Medicare Other

## 2018-10-21 ENCOUNTER — Encounter (HOSPITAL_COMMUNITY): Payer: Self-pay | Admitting: *Deleted

## 2018-10-21 ENCOUNTER — Ambulatory Visit (HOSPITAL_COMMUNITY): Payer: Medicare Other | Admitting: Anesthesiology

## 2018-10-21 DIAGNOSIS — Z419 Encounter for procedure for purposes other than remedying health state, unspecified: Secondary | ICD-10-CM

## 2018-10-21 DIAGNOSIS — Z7982 Long term (current) use of aspirin: Secondary | ICD-10-CM | POA: Insufficient documentation

## 2018-10-21 DIAGNOSIS — I10 Essential (primary) hypertension: Secondary | ICD-10-CM | POA: Insufficient documentation

## 2018-10-21 DIAGNOSIS — Z85828 Personal history of other malignant neoplasm of skin: Secondary | ICD-10-CM | POA: Insufficient documentation

## 2018-10-21 DIAGNOSIS — Z79899 Other long term (current) drug therapy: Secondary | ICD-10-CM | POA: Insufficient documentation

## 2018-10-21 DIAGNOSIS — G4761 Periodic limb movement disorder: Secondary | ICD-10-CM | POA: Insufficient documentation

## 2018-10-21 DIAGNOSIS — M5126 Other intervertebral disc displacement, lumbar region: Secondary | ICD-10-CM | POA: Diagnosis present

## 2018-10-21 DIAGNOSIS — M5116 Intervertebral disc disorders with radiculopathy, lumbar region: Secondary | ICD-10-CM | POA: Insufficient documentation

## 2018-10-21 DIAGNOSIS — G4730 Sleep apnea, unspecified: Secondary | ICD-10-CM | POA: Insufficient documentation

## 2018-10-21 HISTORY — PX: LUMBAR LAMINECTOMY/DECOMPRESSION MICRODISCECTOMY: SHX5026

## 2018-10-21 SURGERY — LUMBAR LAMINECTOMY/DECOMPRESSION MICRODISCECTOMY 1 LEVEL
Anesthesia: General | Site: Back | Laterality: Right

## 2018-10-21 MED ORDER — ALUM & MAG HYDROXIDE-SIMETH 200-200-20 MG/5ML PO SUSP: 30.0000 mL | Freq: Four times a day (QID) | ORAL | Status: DC | PRN

## 2018-10-21 MED ORDER — ONDANSETRON HCL 4 MG/2ML IJ SOLN
INTRAMUSCULAR | Status: AC
  Filled 2018-10-21: qty 2

## 2018-10-21 MED ORDER — CHLORHEXIDINE GLUCONATE CLOTH 2 % EX PADS: 6.0000 | MEDICATED_PAD | Freq: Once | CUTANEOUS | Status: DC

## 2018-10-21 MED ORDER — FENTANYL CITRATE (PF) 250 MCG/5ML IJ SOLN
INTRAMUSCULAR | Status: AC
  Filled 2018-10-21: qty 5

## 2018-10-21 MED ORDER — DEXAMETHASONE SODIUM PHOSPHATE 4 MG/ML IJ SOLN
INTRAMUSCULAR | Status: DC | PRN
  Administered 2018-10-21: 10 mg via INTRAVENOUS

## 2018-10-21 MED ORDER — SODIUM CHLORIDE 0.9 % IV SOLN
INTRAVENOUS | Status: DC | PRN
  Administered 2018-10-21: 20 ug/min via INTRAVENOUS

## 2018-10-21 MED ORDER — THROMBIN 5000 UNITS EX SOLR
CUTANEOUS | Status: AC
  Filled 2018-10-21: qty 10000

## 2018-10-21 MED ORDER — GABAPENTIN 300 MG PO CAPS
600.0000 mg | ORAL_CAPSULE | Freq: Every day | ORAL | Status: DC
  Administered 2018-10-21: 600 mg via ORAL
  Filled 2018-10-21 (×2): qty 2

## 2018-10-21 MED ORDER — CEFAZOLIN SODIUM-DEXTROSE 2-4 GM/100ML-% IV SOLN
2.0000 g | INTRAVENOUS | Status: AC
  Administered 2018-10-21: 2 g via INTRAVENOUS
  Filled 2018-10-21: qty 100

## 2018-10-21 MED ORDER — PANTOPRAZOLE SODIUM 40 MG IV SOLR: 40.0000 mg | Freq: Every day | INTRAVENOUS | Status: DC

## 2018-10-21 MED ORDER — OXYCODONE HCL 5 MG/5ML PO SOLN: 5.0000 mg | Freq: Once | ORAL | Status: AC | PRN

## 2018-10-21 MED ORDER — ACETAMINOPHEN 500 MG PO TABS
1000.0000 mg | ORAL_TABLET | Freq: Once | ORAL | Status: AC
  Administered 2018-10-21: 1000 mg via ORAL

## 2018-10-21 MED ORDER — DEXAMETHASONE SODIUM PHOSPHATE 10 MG/ML IJ SOLN
10.0000 mg | INTRAMUSCULAR | Status: DC
  Filled 2018-10-21: qty 1

## 2018-10-21 MED ORDER — LIDOCAINE-EPINEPHRINE 1 %-1:100000 IJ SOLN
INTRAMUSCULAR | Status: DC | PRN
  Administered 2018-10-21: 10 mL via INTRADERMAL

## 2018-10-21 MED ORDER — 0.9 % SODIUM CHLORIDE (POUR BTL) OPTIME
TOPICAL | Status: DC | PRN
  Administered 2018-10-21: 1000 mL

## 2018-10-21 MED ORDER — IBUPROFEN 200 MG PO TABS: 400.0000 mg | ORAL_TABLET | Freq: Four times a day (QID) | ORAL | Status: DC | PRN

## 2018-10-21 MED ORDER — THROMBIN 5000 UNITS EX SOLR
CUTANEOUS | Status: DC | PRN
  Administered 2018-10-21 (×2): 5000 [IU] via TOPICAL

## 2018-10-21 MED ORDER — SODIUM CHLORIDE 0.9% FLUSH: 3.0000 mL | Freq: Two times a day (BID) | INTRAVENOUS | Status: DC

## 2018-10-21 MED ORDER — ACETAMINOPHEN 160 MG/5ML PO SOLN: 1000.0000 mg | Freq: Once | ORAL | Status: DC | PRN

## 2018-10-21 MED ORDER — OXYCODONE HCL 5 MG PO TABS
10.0000 mg | ORAL_TABLET | ORAL | Status: DC | PRN
  Administered 2018-10-22: 5 mg via ORAL
  Filled 2018-10-21 (×2): qty 2

## 2018-10-21 MED ORDER — LIDOCAINE HCL (CARDIAC) PF 100 MG/5ML IV SOSY
PREFILLED_SYRINGE | INTRAVENOUS | Status: DC | PRN
  Administered 2018-10-21: 60 mg via INTRAVENOUS

## 2018-10-21 MED ORDER — FENTANYL CITRATE (PF) 100 MCG/2ML IJ SOLN: 25.0000 ug | INTRAMUSCULAR | Status: DC | PRN

## 2018-10-21 MED ORDER — SODIUM CHLORIDE 0.9 % IV SOLN: 250.0000 mL | INTRAVENOUS | Status: DC

## 2018-10-21 MED ORDER — ROCURONIUM BROMIDE 50 MG/5ML IV SOSY
PREFILLED_SYRINGE | INTRAVENOUS | Status: AC
  Filled 2018-10-21: qty 10

## 2018-10-21 MED ORDER — ACETAMINOPHEN 500 MG PO TABS: 1000.0000 mg | ORAL_TABLET | Freq: Four times a day (QID) | ORAL | Status: DC | PRN

## 2018-10-21 MED ORDER — SODIUM CHLORIDE 0.9% FLUSH: 3.0000 mL | INTRAVENOUS | Status: DC | PRN

## 2018-10-21 MED ORDER — BUPIVACAINE HCL (PF) 0.25 % IJ SOLN
INTRAMUSCULAR | Status: AC
  Filled 2018-10-21: qty 30

## 2018-10-21 MED ORDER — ASPIRIN EC 81 MG PO TBEC: 81.0000 mg | DELAYED_RELEASE_TABLET | Freq: Every day | ORAL | Status: DC

## 2018-10-21 MED ORDER — LIDOCAINE-EPINEPHRINE 1 %-1:100000 IJ SOLN
INTRAMUSCULAR | Status: AC
  Filled 2018-10-21: qty 1

## 2018-10-21 MED ORDER — DILTIAZEM HCL ER COATED BEADS 180 MG PO CP24
180.0000 mg | ORAL_CAPSULE | Freq: Every day | ORAL | Status: DC
  Filled 2018-10-21: qty 1

## 2018-10-21 MED ORDER — ACETAMINOPHEN 650 MG RE SUPP: 650.0000 mg | RECTAL | Status: DC | PRN

## 2018-10-21 MED ORDER — CYCLOBENZAPRINE HCL 10 MG PO TABS: 10.0000 mg | ORAL_TABLET | Freq: Three times a day (TID) | ORAL | Status: DC | PRN

## 2018-10-21 MED ORDER — FENTANYL CITRATE (PF) 100 MCG/2ML IJ SOLN
INTRAMUSCULAR | Status: DC | PRN
  Administered 2018-10-21: 100 ug via INTRAVENOUS

## 2018-10-21 MED ORDER — LACTATED RINGERS IV SOLN
INTRAVENOUS | Status: DC
  Administered 2018-10-21 (×2): via INTRAVENOUS

## 2018-10-21 MED ORDER — CEFAZOLIN SODIUM-DEXTROSE 2-4 GM/100ML-% IV SOLN
2.0000 g | Freq: Three times a day (TID) | INTRAVENOUS | Status: AC
  Administered 2018-10-21 – 2018-10-22 (×2): 2 g via INTRAVENOUS
  Filled 2018-10-21 (×2): qty 100

## 2018-10-21 MED ORDER — MENTHOL 3 MG MT LOZG: 1.0000 | LOZENGE | OROMUCOSAL | Status: DC | PRN

## 2018-10-21 MED ORDER — OXYCODONE HCL 5 MG PO TABS
5.0000 mg | ORAL_TABLET | Freq: Once | ORAL | Status: AC | PRN
  Administered 2018-10-21: 5 mg via ORAL

## 2018-10-21 MED ORDER — ACETAMINOPHEN 500 MG PO TABS: 1000.0000 mg | ORAL_TABLET | Freq: Once | ORAL | Status: DC | PRN

## 2018-10-21 MED ORDER — ACETAMINOPHEN 325 MG PO TABS: 650.0000 mg | ORAL_TABLET | ORAL | Status: DC | PRN

## 2018-10-21 MED ORDER — DULOXETINE HCL 30 MG PO CPEP: 60.0000 mg | ORAL_CAPSULE | Freq: Every day | ORAL | Status: DC

## 2018-10-21 MED ORDER — LIDOCAINE 2% (20 MG/ML) 5 ML SYRINGE
INTRAMUSCULAR | Status: AC
  Filled 2018-10-21: qty 5

## 2018-10-21 MED ORDER — ONDANSETRON HCL 4 MG/2ML IJ SOLN: 4.0000 mg | Freq: Four times a day (QID) | INTRAMUSCULAR | Status: DC | PRN

## 2018-10-21 MED ORDER — ROCURONIUM BROMIDE 100 MG/10ML IV SOLN
INTRAVENOUS | Status: DC | PRN
  Administered 2018-10-21: 50 mg via INTRAVENOUS
  Administered 2018-10-21: 20 mg via INTRAVENOUS

## 2018-10-21 MED ORDER — ACETAMINOPHEN 500 MG PO TABS
ORAL_TABLET | ORAL | Status: AC
  Filled 2018-10-21: qty 2

## 2018-10-21 MED ORDER — ZOLPIDEM TARTRATE 5 MG PO TABS: 5.0000 mg | ORAL_TABLET | Freq: Every evening | ORAL | Status: DC | PRN

## 2018-10-21 MED ORDER — PROPOFOL 10 MG/ML IV BOLUS
INTRAVENOUS | Status: AC
  Filled 2018-10-21: qty 20

## 2018-10-21 MED ORDER — SODIUM CHLORIDE 0.9 % IV SOLN
INTRAVENOUS | Status: DC | PRN
  Administered 2018-10-21: 16:00:00

## 2018-10-21 MED ORDER — SUGAMMADEX SODIUM 200 MG/2ML IV SOLN
INTRAVENOUS | Status: DC | PRN
  Administered 2018-10-21: 180 mg via INTRAVENOUS

## 2018-10-21 MED ORDER — PHENOL 1.4 % MT LIQD: 1.0000 | OROMUCOSAL | Status: DC | PRN

## 2018-10-21 MED ORDER — HYDROMORPHONE HCL 1 MG/ML IJ SOLN: 0.5000 mg | INTRAMUSCULAR | Status: DC | PRN

## 2018-10-21 MED ORDER — ACETAMINOPHEN 10 MG/ML IV SOLN: 1000.0000 mg | Freq: Once | INTRAVENOUS | Status: DC | PRN

## 2018-10-21 MED ORDER — OXYCODONE HCL 5 MG PO TABS
ORAL_TABLET | ORAL | Status: AC
  Administered 2018-10-21: 5 mg
  Filled 2018-10-21: qty 1

## 2018-10-21 MED ORDER — PANTOPRAZOLE SODIUM 40 MG PO TBEC: 40.0000 mg | DELAYED_RELEASE_TABLET | Freq: Every day | ORAL | Status: DC

## 2018-10-21 MED ORDER — ONDANSETRON HCL 4 MG PO TABS: 4.0000 mg | ORAL_TABLET | Freq: Four times a day (QID) | ORAL | Status: DC | PRN

## 2018-10-21 MED ORDER — ONDANSETRON HCL 4 MG/2ML IJ SOLN
INTRAMUSCULAR | Status: DC | PRN
  Administered 2018-10-21: 4 mg via INTRAVENOUS

## 2018-10-21 MED ORDER — HEMOSTATIC AGENTS (NO CHARGE) OPTIME
TOPICAL | Status: DC | PRN
  Administered 2018-10-21: 1 via TOPICAL

## 2018-10-21 MED ORDER — THROMBIN 5000 UNITS EX SOLR
CUTANEOUS | Status: AC
  Filled 2018-10-21: qty 5000

## 2018-10-21 MED ORDER — HYDROCHLOROTHIAZIDE 12.5 MG PO CAPS
12.5000 mg | ORAL_CAPSULE | Freq: Every day | ORAL | Status: DC
  Administered 2018-10-21: 12.5 mg via ORAL
  Filled 2018-10-21: qty 1

## 2018-10-21 MED ORDER — PROPOFOL 10 MG/ML IV BOLUS
INTRAVENOUS | Status: DC | PRN
  Administered 2018-10-21: 130 mg via INTRAVENOUS

## 2018-10-21 SURGICAL SUPPLY — 52 items
BAG DECANTER FOR FLEXI CONT (MISCELLANEOUS) ×3 IMPLANT
BENZOIN TINCTURE PRP APPL 2/3 (GAUZE/BANDAGES/DRESSINGS) ×3 IMPLANT
BLADE CLIPPER SURG (BLADE) IMPLANT
BLADE SURG 11 STRL SS (BLADE) ×3 IMPLANT
BUR CUTTER 7.0 ROUND (BURR) ×3 IMPLANT
BUR MATCHSTICK NEURO 3.0 LAGG (BURR) ×3 IMPLANT
CANISTER SUCT 3000ML PPV (MISCELLANEOUS) ×3 IMPLANT
CARTRIDGE OIL MAESTRO DRILL (MISCELLANEOUS) ×1 IMPLANT
CLOSURE WOUND 1/2 X4 (GAUZE/BANDAGES/DRESSINGS) ×1
COVER WAND RF STERILE (DRAPES) IMPLANT
DECANTER SPIKE VIAL GLASS SM (MISCELLANEOUS) ×3 IMPLANT
DERMABOND ADVANCED (GAUZE/BANDAGES/DRESSINGS) ×2
DERMABOND ADVANCED .7 DNX12 (GAUZE/BANDAGES/DRESSINGS) ×1 IMPLANT
DIFFUSER DRILL AIR PNEUMATIC (MISCELLANEOUS) ×3 IMPLANT
DRAPE HALF SHEET 40X57 (DRAPES) IMPLANT
DRAPE LAPAROTOMY 100X72X124 (DRAPES) ×3 IMPLANT
DRAPE MICROSCOPE LEICA (MISCELLANEOUS) ×3 IMPLANT
DRAPE SURG 17X23 STRL (DRAPES) ×3 IMPLANT
DRSG OPSITE POSTOP 4X6 (GAUZE/BANDAGES/DRESSINGS) ×3 IMPLANT
DURAPREP 26ML APPLICATOR (WOUND CARE) ×3 IMPLANT
ELECT REM PT RETURN 9FT ADLT (ELECTROSURGICAL) ×3
ELECTRODE REM PT RTRN 9FT ADLT (ELECTROSURGICAL) ×1 IMPLANT
GAUZE 4X4 16PLY RFD (DISPOSABLE) IMPLANT
GAUZE SPONGE 4X4 12PLY STRL (GAUZE/BANDAGES/DRESSINGS) ×3 IMPLANT
GLOVE BIO SURGEON STRL SZ7 (GLOVE) IMPLANT
GLOVE BIO SURGEON STRL SZ8 (GLOVE) ×3 IMPLANT
GLOVE BIOGEL PI IND STRL 7.0 (GLOVE) IMPLANT
GLOVE BIOGEL PI INDICATOR 7.0 (GLOVE)
GLOVE EXAM NITRILE XL STR (GLOVE) IMPLANT
GLOVE INDICATOR 8.5 STRL (GLOVE) ×3 IMPLANT
GOWN STRL REUS W/ TWL LRG LVL3 (GOWN DISPOSABLE) ×2 IMPLANT
GOWN STRL REUS W/ TWL XL LVL3 (GOWN DISPOSABLE) ×2 IMPLANT
GOWN STRL REUS W/TWL 2XL LVL3 (GOWN DISPOSABLE) IMPLANT
GOWN STRL REUS W/TWL LRG LVL3 (GOWN DISPOSABLE) ×4
GOWN STRL REUS W/TWL XL LVL3 (GOWN DISPOSABLE) ×4
KIT BASIN OR (CUSTOM PROCEDURE TRAY) ×3 IMPLANT
KIT TURNOVER KIT B (KITS) ×3 IMPLANT
NEEDLE HYPO 22GX1.5 SAFETY (NEEDLE) ×3 IMPLANT
NEEDLE SPNL 22GX3.5 QUINCKE BK (NEEDLE) ×3 IMPLANT
NS IRRIG 1000ML POUR BTL (IV SOLUTION) ×3 IMPLANT
OIL CARTRIDGE MAESTRO DRILL (MISCELLANEOUS) ×3
PACK LAMINECTOMY NEURO (CUSTOM PROCEDURE TRAY) ×3 IMPLANT
RUBBERBAND STERILE (MISCELLANEOUS) ×6 IMPLANT
SPONGE SURGIFOAM ABS GEL SZ50 (HEMOSTASIS) ×3 IMPLANT
STRIP CLOSURE SKIN 1/2X4 (GAUZE/BANDAGES/DRESSINGS) ×2 IMPLANT
SUT VIC AB 0 CT1 18XCR BRD8 (SUTURE) ×1 IMPLANT
SUT VIC AB 0 CT1 8-18 (SUTURE) ×2
SUT VIC AB 2-0 CT1 18 (SUTURE) ×3 IMPLANT
SUT VICRYL 4-0 PS2 18IN ABS (SUTURE) ×3 IMPLANT
TOWEL GREEN STERILE (TOWEL DISPOSABLE) ×3 IMPLANT
TOWEL GREEN STERILE FF (TOWEL DISPOSABLE) ×3 IMPLANT
WATER STERILE IRR 1000ML POUR (IV SOLUTION) ×3 IMPLANT

## 2018-10-21 NOTE — Transfer of Care (Signed)
Immediate Anesthesia Transfer of Care Note  Patient: Mark Briggs  Procedure(s) Performed: Microdiscectomy - Lumbar Four-Five - right Redo (Right Back)  Patient Location: PACU  Anesthesia Type:General  Level of Consciousness: awake, alert , oriented and patient cooperative  Airway & Oxygen Therapy: Patient Spontanous Breathing and Patient connected to nasal cannula oxygen  Post-op Assessment: Report given to RN and Post -op Vital signs reviewed and stable  Post vital signs: Reviewed and stable  Last Vitals:  Vitals Value Taken Time  BP 144/71 10/21/2018  5:31 PM  Temp    Pulse 78 10/21/2018  5:44 PM  Resp 11 10/21/2018  5:44 PM  SpO2 96 % 10/21/2018  5:44 PM  Vitals shown include unvalidated device data.  Last Pain:  Vitals:   10/21/18 1730  TempSrc:   PainSc: 0-No pain         Complications: No apparent anesthesia complications

## 2018-10-21 NOTE — Anesthesia Procedure Notes (Signed)
Procedure Name: Intubation Date/Time: 10/21/2018 3:51 PM Performed by: Oletta Lamas, CRNA Pre-anesthesia Checklist: Patient identified, Emergency Drugs available, Suction available and Patient being monitored Patient Re-evaluated:Patient Re-evaluated prior to induction Oxygen Delivery Method: Circle System Utilized Preoxygenation: Pre-oxygenation with 100% oxygen Induction Type: IV induction Ventilation: Mask ventilation without difficulty Laryngoscope Size: Miller and 3 Grade View: Grade III Tube type: Oral Tube size: 7.5 mm Number of attempts: 1 Airway Equipment and Method: Stylet Placement Confirmation: ETT inserted through vocal cords under direct vision,  positive ETCO2 and breath sounds checked- equal and bilateral Secured at: 23 cm Tube secured with: Tape Dental Injury: Teeth and Oropharynx as per pre-operative assessment

## 2018-10-21 NOTE — Op Note (Signed)
Preoperative diagnosis: Right L5 radiculopathy from herniated this pulposus L4-5 right  Postoperative diagnosis: Same  Procedure: Lumbar laminectomy microscopic discectomy L4-5 on the right with microdissection of the right L5 nerve root microscopic discectomy  Surgeon:  Kary Kos  Asst.:Kimberly Meyran  Anesthesia: Gen.  EBL: Minimal  History of present illness:83 year old gentleman who presented with a foot drop on the right with 4-5 weakness in dorsiflexion imaging showed a very large free fragment disc migrating caudally L4-5 disc space displacing the right L5 pedicle. Due to patient's failure conservative treatment progressive conical syndrome imaging findings have recommended laminectomy microdiscectomy at that level. I extensively went over the risks and benefits of the operation with him as well as perioperative course expectations of outcome and alternatives surgery and he understood and agreed to proceed forward.  Operative procedure: Patient was brought in the or was induced under general anesthesia positioned prone the Wilson frame his back was prepped and draped in routine sterile fashion preoperative x-ray localize the appropriate level so after infiltration of 10 mL lidocaine with epi midline incision was made and Bovie left car was used to take down the subcutaneous tissues and subperiosteal dissections care lamina of L4 and L5 on the right. Interoperative x-ray confirmed identification appropriate level. So that a high-speed drill was used to drill down the lamina on the infra-aspect of L4 medial facet complex super aspect of lamina L5 medial facet complex was then under bitten the leg was identified and removed in piecemeal fashion exposing the thecal sac. Then under microscopic illumination under biting the medial gutter further identify the L5 pedicle I extended the L5 lamina inferiorly to below the L5 pedicle dissected up underneath the L5 nerve root and removed several large  free fragments of disc from underneath the L5 nerve root. Then looked at the disc space and during compression of disc space a few more fragments Disc space I decided to cut of the space and cleaned out the disc space radically with pituitary rongeurs Epstein curettes. At the discectomy was no further stenosis on thecal sac or L5 nerve root. The wound scope was irrigated meticulous in space was maintained Gelfoam was opened up the dura and the muscle fascia proximate layers with after Vicryl and skin was closed running 4 subcuticular Dermabond benzo and Steri-Strips and sterile dressing was applied patient recovered in stable condition. At the end the case all needle counts sponge counts were correct.

## 2018-10-21 NOTE — Plan of Care (Signed)
  Problem: Education: Goal: Ability to verbalize activity precautions or restrictions will improve Outcome: Progressing Goal: Knowledge of the prescribed therapeutic regimen will improve Outcome: Progressing Goal: Understanding of discharge needs will improve Outcome: Progressing   Problem: Activity: Goal: Ability to avoid complications of mobility impairment will improve Outcome: Progressing Goal: Ability to tolerate increased activity will improve Outcome: Progressing Goal: Will remain free from falls Outcome: Progressing   Problem: Bowel/Gastric: Goal: Gastrointestinal status for postoperative course will improve Outcome: Progressing   Problem: Pain Management: Goal: Pain level will decrease Outcome: Progressing   Problem: Bladder/Genitourinary: Goal: Urinary functional status for postoperative course will improve Outcome: Progressing   Problem: Safety: Goal: Ability to remain free from injury will improve Outcome: Progressing   

## 2018-10-21 NOTE — H&P (Signed)
Mark Briggs is an 83 y.o. male.   Chief Complaint: back and right leg pain HPI: 83 year old woman with back and right leg pain with foot drop presented with imaging fine showed a large disc herniation at L4-5 on the right with a, free fragment displacing the right L5 nerve disorder L5 pedicle due to patient's failure conservative treatment imaging findings and progressive clinical syndrome I recommended laminectomy microscope ectomy at L4-5 on the right I extensively went over the risks and benefits of the operation with him as well as perioperative course expectations of outcome and alternatives of surgery and he understands and agrees to proceed forward.  Past Medical History:  Diagnosis Date  . Cancer ([place])    skin cancer. Prostate  . Complication of anesthesia    difficult to awake after endo procedure 2000ish  . Dysrhythmia    at times  . GERD (gastroesophageal reflux disease)    no medication for it  . Hypertension   . Periodic limb movement disorder    severe  . Sleep apnea    mild no cpap at this time    Past Surgical History:  Procedure Laterality Date  . APPENDECTOMY    . BOWEL RESECTION    . CARPAL TUNNEL RELEASE Right   . CHOLECYSTECTOMY    . COLONOSCOPY    . EYE SURGERY Bilateral    Catarct with lens  . FINGER SURGERY Left    fluid removal  . LUMBAR LAMINECTOMY/DECOMPRESSION MICRODISCECTOMY Right 04/20/2014   Procedure: Right L4-5 Extraforaminal Discectomy;  Surgeon: Floyce Stakes, MD;  Location: [facility];  Service: Neurosurgery;  Laterality: Right;  . PROSTATECTOMY    . TONSILLECTOMY      History reviewed. No pertinent family history. Social History:  reports that he has never smoked. He has never used smokeless tobacco. He reports current alcohol use of about 1.0 standard drinks of alcohol per week. He reports that he does not use drugs.  Allergies:  Allergies  Allergen Reactions  . Codeine Other (See Comments)    dizziness    Medications Prior to  Admission  Medication Sig Dispense Refill  . acetaminophen (TYLENOL) 500 MG tablet Take 1,000 mg by mouth every 6 (six) hours as needed for moderate pain or headache.     Marland Kitchen aspirin EC 81 MG tablet Take 81 mg by mouth daily.     . cimetidine (TAGAMET) 200 MG tablet Take 200 mg by mouth at bedtime.    Marland Kitchen diltiazem (DILACOR XR) 180 MG 24 hr capsule Take 180 mg by mouth daily.     . DULoxetine (CYMBALTA) 60 MG capsule Take 60 mg by mouth daily.    Marland Kitchen gabapentin (NEURONTIN) 300 MG capsule Take 600 mg by mouth at bedtime.    . hydrochlorothiazide (MICROZIDE) 12.5 MG capsule Take 12.5 mg by mouth daily.     Marland Kitchen ibuprofen (ADVIL,MOTRIN) 200 MG tablet Take 400 mg by mouth every 6 (six) hours as needed for headache or moderate pain.     Marland Kitchen omeprazole (PRILOSEC) 40 MG capsule Take 40 mg by mouth daily.    Marland Kitchen zolpidem (AMBIEN) 10 MG tablet Take 5 mg by mouth at bedtime as needed for sleep.       No results found for this or any previous visit (from the past 48 hour(s)). No results found.  Review of Systems  Musculoskeletal: Positive for back pain.  Neurological: Positive for tingling and sensory change.    Blood pressure (!) 130/53, pulse 73, temperature 97.9  F (36.6 C), temperature source Oral, resp. rate 18, height 5\' 11"  (1.803 m), weight 83.9 kg, SpO2 99 %. Physical Exam  Constitutional: He is oriented to person, place, and time. He appears well-developed.  Eyes: Pupils are equal, round, and reactive to light.  Neck: Normal range of motion.  Respiratory: Effort normal.  GI: Soft.  Neurological: He is alert and oriented to person, place, and time. GCS eye subscore is 4. GCS verbal subscore is 5. GCS motor subscore is 6.  Right-sided foot drop at 4 out of 5 otherwise is 5 out of 5 iliopsoas, quads, hamstrings, gastric, into tibialis, and EHL.  Skin: Skin is warm and dry.     Assessment/Plan 83 year old presents for right-sided laminectomy microdiscectomy at L4-5  Justin Buechner P, MD 10/21/2018,  3:16 PM

## 2018-10-22 ENCOUNTER — Encounter (HOSPITAL_COMMUNITY): Payer: Self-pay | Admitting: Neurosurgery

## 2018-10-22 DIAGNOSIS — M5116 Intervertebral disc disorders with radiculopathy, lumbar region: Secondary | ICD-10-CM | POA: Diagnosis not present

## 2018-10-22 MED ORDER — ONDANSETRON HCL 4 MG/2ML IJ SOLN
INTRAMUSCULAR | Status: AC
  Filled 2018-10-22: qty 2

## 2018-10-22 MED ORDER — LIDOCAINE 2% (20 MG/ML) 5 ML SYRINGE
INTRAMUSCULAR | Status: AC
  Filled 2018-10-22: qty 5

## 2018-10-22 MED ORDER — DEXAMETHASONE SODIUM PHOSPHATE 10 MG/ML IJ SOLN
INTRAMUSCULAR | Status: AC
  Filled 2018-10-22: qty 1

## 2018-10-22 NOTE — Evaluation (Signed)
Physical Therapy Evaluation and Discharge Patient Details Name: Mark Briggs MRN: [id number] DOB: 29-May-1936 Today's Date: 10/22/2018   History of Present Illness  Pt is an 83 y/o male who presents s/p L4-L5 laminectomy/decompression on 10/21/2018. PMH significant for periodic limb movement disorder, HTN, CA.  Clinical Impression  Patient evaluated by Physical Therapy with no further acute PT needs identified. All education has been completed and the patient has no further questions. At the time of PT eval pt was able to perform transfers and ambulation with gross modified independence and use of SPC for support. Pt and wife were educated on precautions, car transfer, activity progression and stair negotiation. See below for any follow-up Physical Therapy or equipment needs. PT is signing off. Thank you for this referral.     Follow Up Recommendations No PT follow up;Supervision - Intermittent    Equipment Recommendations  None recommended by PT    Recommendations for Other Services       Precautions / Restrictions Precautions Precautions: Fall;Back Precaution Booklet Issued: Yes (comment) Precaution Comments: Reviewed with pt and wife. Pt was cued for maintenance of precautions during functional mobility.  Restrictions Weight Bearing Restrictions: No      Mobility  Bed Mobility               General bed mobility comments: Pt was received sitting up EOB. Verbally reviewed log roll technique.   Transfers Overall transfer level: Modified independent Equipment used: Rolling walker (2 wheeled)             General transfer comment: Pt demonstrated proper hand placement on seated surface for safety. No assist required.   Ambulation/Gait Ambulation/Gait assistance: Modified independent (Device/Increase time) Gait Distance (Feet): 400 Feet Assistive device: Single point cane Gait Pattern/deviations: Step-through pattern;Decreased stride length;Trunk flexed Gait velocity:  Decreased Gait velocity interpretation: 1.31 - 2.62 ft/sec, indicative of limited community ambulator General Gait Details: Slow but generally steady with cane for support. No assist required. VC's for improved posture throughout gait training.   Stairs Stairs: Yes Stairs assistance: Supervision Stair Management: One rail Right;Step to pattern;Forwards Number of Stairs: 10 General stair comments: VC's for sequencing and safety. Pt negotiated stairs well without assistance.   Wheelchair Mobility    Modified Rankin (Stroke Patients Only)       Balance Overall balance assessment: Mild deficits observed, not formally tested                                           Pertinent Vitals/Pain Pain Assessment: Faces Faces Pain Scale: Hurts little more Pain Location: Incision site Pain Descriptors / Indicators: Operative site guarding;Discomfort Pain Intervention(s): Monitored during session;Repositioned    Home Living Family/patient expects to be discharged to:: Private residence Living Arrangements: Spouse/significant other Available Help at Discharge: Family;Available 24 hours/day Type of Home: House Home Access: Stairs to enter Entrance Stairs-Rails: Left Entrance Stairs-Number of Steps: 3 Home Layout: Two level Home Equipment: Walker - 2 wheels;Cane - quad      Prior Function Level of Independence: Independent               Hand Dominance   Dominant Hand: Right    Extremity/Trunk Assessment   Upper Extremity Assessment Upper Extremity Assessment: Defer to OT evaluation    Lower Extremity Assessment Lower Extremity Assessment: Generalized weakness(Consistent with pre-op diagnosis)    Cervical / Trunk Assessment Cervical / Trunk  Assessment: Other exceptions Cervical / Trunk Exceptions: s/p surgery  Communication   Communication: No difficulties  Cognition Arousal/Alertness: Awake/alert Behavior During Therapy: WFL for tasks  assessed/performed Overall Cognitive Status: Within Functional Limits for tasks assessed                                        General Comments      Exercises     Assessment/Plan    PT Assessment Patient needs continued PT services  PT Problem List Decreased strength;Decreased balance;Decreased activity tolerance;Decreased mobility;Decreased knowledge of use of DME;Decreased safety awareness;Decreased knowledge of precautions;Pain       PT Treatment Interventions DME instruction;Gait training;Stair training;Functional mobility training;Therapeutic activities;Therapeutic exercise;Neuromuscular re-education;Patient/family education    PT Goals (Current goals can be found in the Care Plan section)  Acute Rehab PT Goals Patient Stated Goal: Home today PT Goal Formulation: All assessment and education complete, DC therapy    Frequency Min 5X/week   Barriers to discharge        Co-evaluation               AM-PAC PT "6 Clicks" Mobility  Outcome Measure Help needed turning from your back to your side while in a flat bed without using bedrails?: None Help needed moving from lying on your back to sitting on the side of a flat bed without using bedrails?: None Help needed moving to and from a bed to a chair (including a wheelchair)?: None Help needed standing up from a chair using your arms (e.g., wheelchair or bedside chair)?: None Help needed to walk in hospital room?: None Help needed climbing 3-5 steps with a railing? : A Little 6 Click Score: 23    End of Session Equipment Utilized During Treatment: Gait belt Activity Tolerance: Patient tolerated treatment well Patient left: in chair;with call bell/phone within reach;with family/visitor present Nurse Communication: Mobility status PT Visit Diagnosis: Unsteadiness on feet (R26.81);Pain;Other symptoms and signs involving the nervous system (R29.898) Pain - part of body: (back)    Time: 9628-3662 PT  Time Calculation (min) (ACUTE ONLY): 29 min   Charges:   PT Evaluation $PT Eval Low Complexity: 1 Low PT Treatments $Gait Training: 8-22 mins        Rolinda Roan, PT, DPT Acute Rehabilitation Services Pager: [phone] Office: [phone]   Thelma Comp 10/22/2018, 10:03 AM

## 2018-10-22 NOTE — Progress Notes (Signed)
Pt doing well. Pt and wife given D/C instructions with verbal understanding. Pt's incision is clean and dry with no sign of infection. Pt's IV was removed prior to D/C. PT D/C'd home via wheelchair per MD order. Pt is stable @ D/C and has no other needs at this time. Holli Humbles, RN

## 2018-10-22 NOTE — Discharge Instructions (Signed)

## 2018-10-22 NOTE — Discharge Summary (Signed)
  Physician Discharge Summary  Patient ID: Mark Briggs MRN: [id number] DOB/AGE: 05-08-1936 83 y.o. Estimated body mass index is 25.8 kg/m as calculated from the following:   Height as of this encounter: 5\' 11"  (1.803 m).   Weight as of this encounter: 83.9 kg.   Admit date: 10/21/2018 Discharge date: 10/22/2018  Admission Diagnoses:herniated nucleus pulposus L4-5 right  Discharge Diagnoses:  Active Problems:   HNP (herniated nucleus pulposus), lumbar   Discharged Condition: good  Hospital Course: patient is medical Hospital underwent lumbar microdiscectomy postoperatively patient did very well recovered in the floor on the floor was angling and voiding spontaneously tolerating regular diet stable for discharge home.  Consults: Significant Diagnostic Studies: Treatments:lumbar microscopic discectomy L4-5 right Discharge Exam: Blood pressure 126/83, pulse 77, temperature 97.9 F (36.6 C), temperature source Oral, resp. rate 16, height 5\' 11"  (1.803 m), weight 83.9 kg, SpO2 99 %. Strength plus out of 5 dorsiflexor right foot  Disposition: home   Allergies as of 10/22/2018      Reactions   Codeine Other (See Comments)   dizziness      Medication List    TAKE these medications   acetaminophen 500 MG tablet Commonly known as:  TYLENOL Take 1,000 mg by mouth every 6 (six) hours as needed for moderate pain or headache.   aspirin EC 81 MG tablet Take 81 mg by mouth daily.   cimetidine 200 MG tablet Commonly known as:  TAGAMET Take 200 mg by mouth at bedtime.   diltiazem 180 MG 24 hr capsule Commonly known as:  DILACOR XR Take 180 mg by mouth daily.   DULoxetine 60 MG capsule Commonly known as:  CYMBALTA Take 60 mg by mouth daily.   gabapentin 300 MG capsule Commonly known as:  NEURONTIN Take 600 mg by mouth at bedtime.   hydrochlorothiazide 12.5 MG capsule Commonly known as:  MICROZIDE Take 12.5 mg by mouth daily.   ibuprofen 200 MG tablet Commonly known as:   ADVIL,MOTRIN Take 400 mg by mouth every 6 (six) hours as needed for headache or moderate pain.   omeprazole 40 MG capsule Commonly known as:  PRILOSEC Take 40 mg by mouth daily.   zolpidem 10 MG tablet Commonly known as:  AMBIEN Take 5 mg by mouth at bedtime as needed for sleep.        Signed: Jakeria Caissie P 10/22/2018, 8:14 AM

## 2018-10-23 NOTE — Anesthesia Postprocedure Evaluation (Signed)
Anesthesia Post Note  Patient: Mark Briggs  Procedure(s) Performed: Microdiscectomy - Lumbar Four-Five - right Redo (Right Back)     Patient location during evaluation: PACU Anesthesia Type: General Level of consciousness: awake and alert Pain management: pain level controlled Vital Signs Assessment: post-procedure vital signs reviewed and stable Respiratory status: spontaneous breathing, nonlabored ventilation, respiratory function stable and patient connected to nasal cannula oxygen Cardiovascular status: blood pressure returned to baseline and stable Postop Assessment: no apparent nausea or vomiting Anesthetic complications: no    Last Vitals:  Vitals:   10/22/18 0333 10/22/18 0746  BP: 109/68 126/83  Pulse: 79 77  Resp: 18 16  Temp: 36.7 C 36.6 C  SpO2: 97% 99%    Last Pain:  Vitals:   10/22/18 0815  TempSrc:   PainSc: 0-No pain                 Elmina Hendel

## 2019-08-11 IMAGING — CR DG LUMBAR SPINE 2-3V
2 series · 2 of 2 positions shown · non-contrast
Comparison: [REDACTED] lumbar MRI 10/01/2018, [REDACTED] CT lumbar myelogram 09/17/2015.

CLINICAL DATA: 83-year-old male undergoing lumbar surgery.

EXAM:
LUMBAR SPINE - 2-3 VIEW

[xtable lateral (1 of 2)]
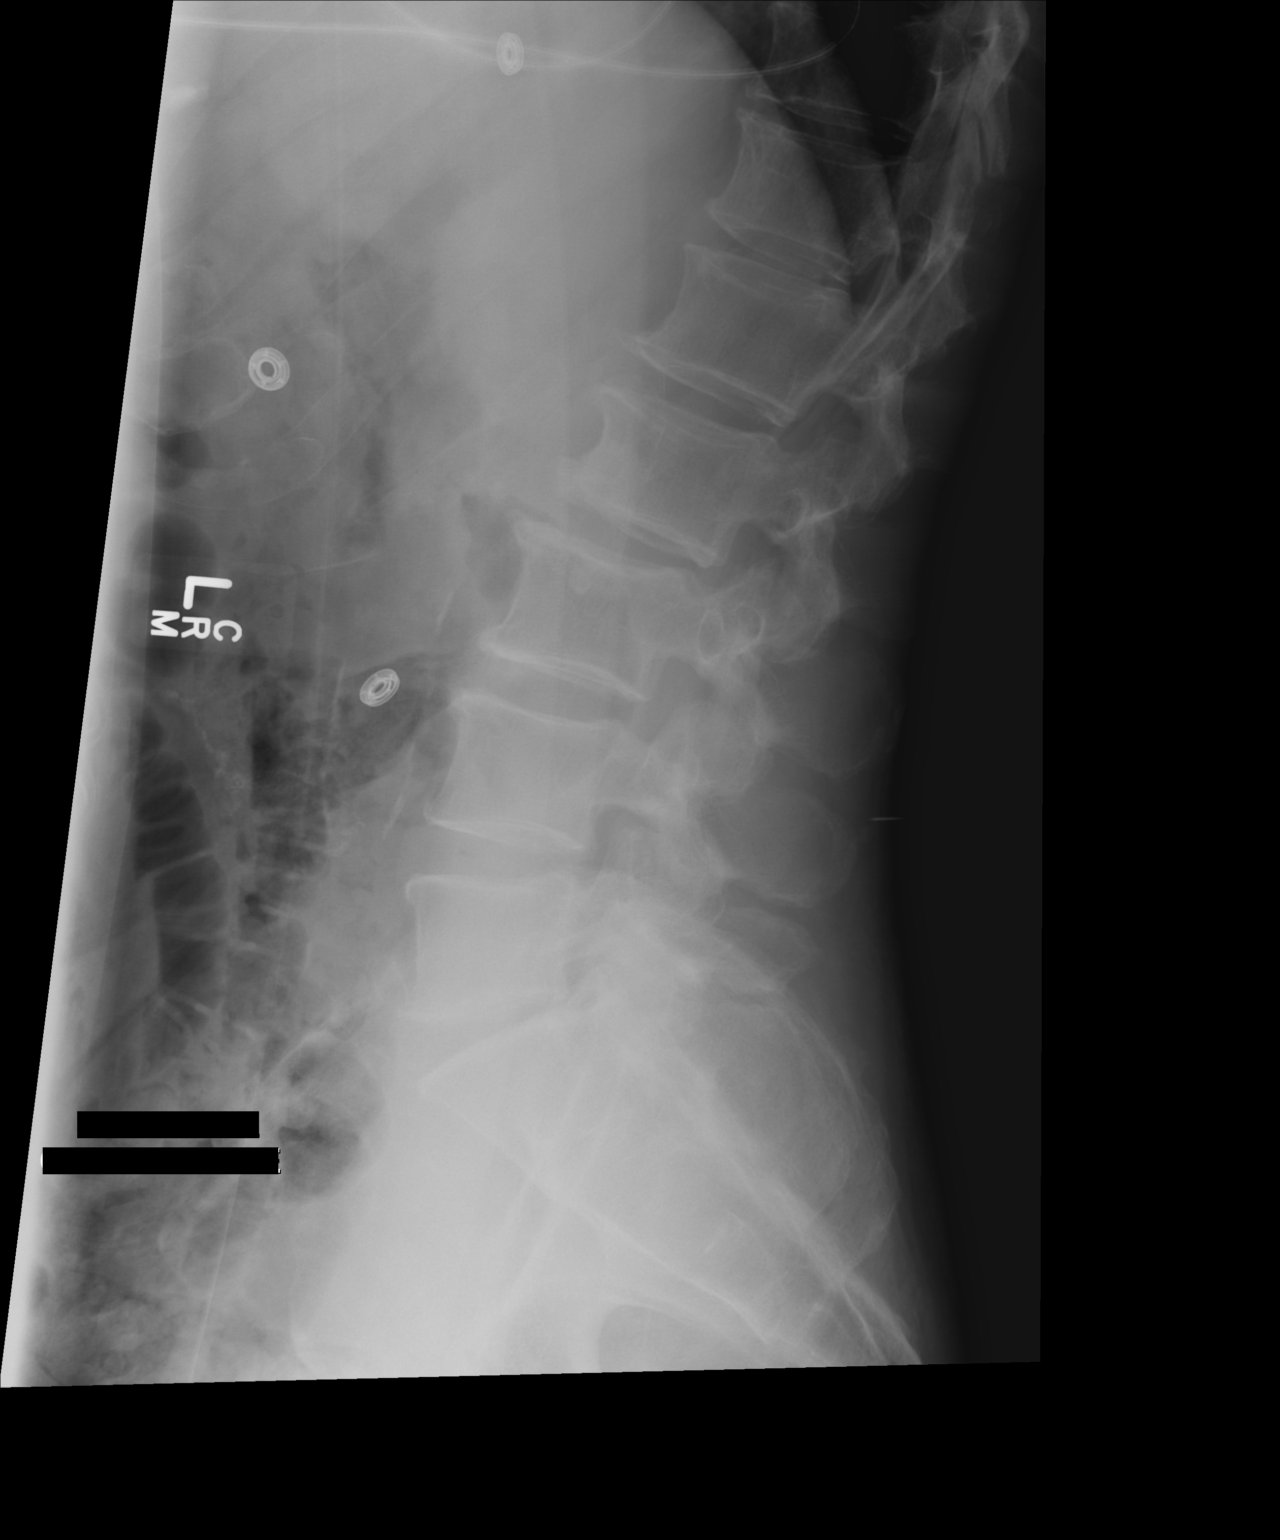

[xtable lateral (2 of 2)]
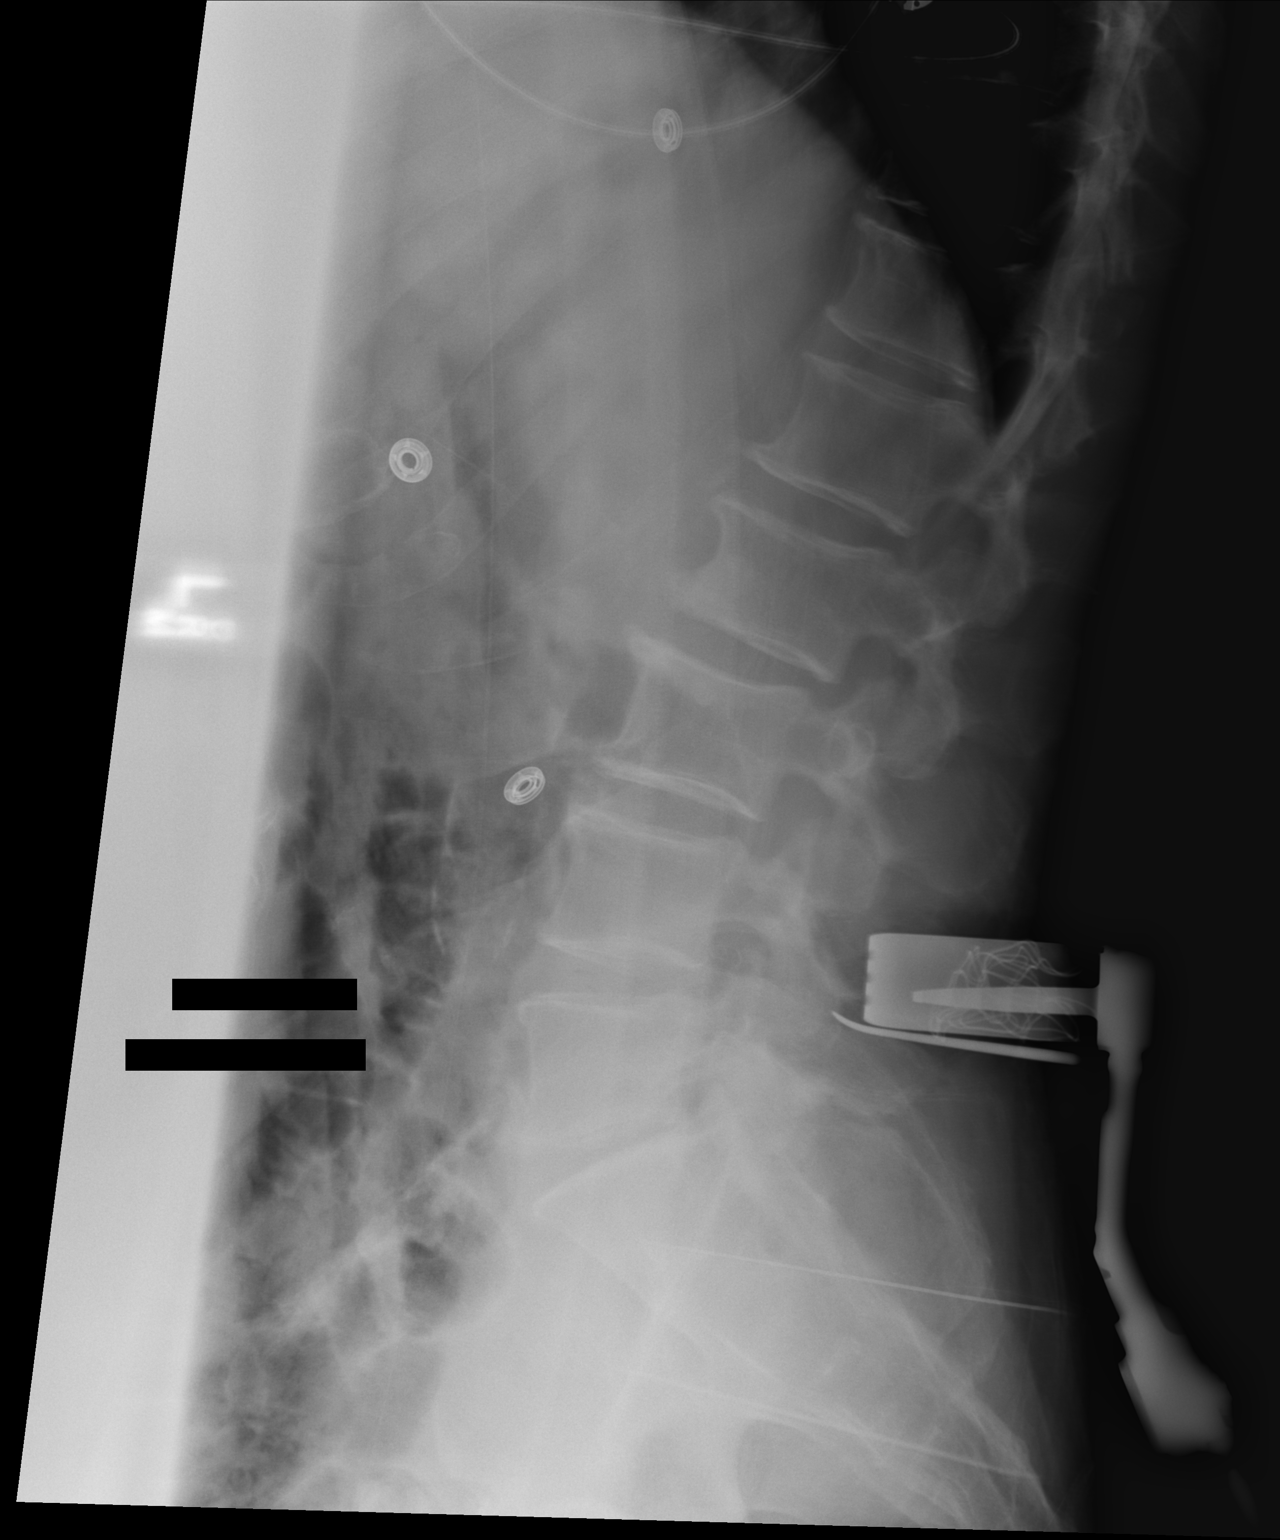

[2 of 2 positions shown; findings below may reference images not displayed]

FINDINGS: Normal lumbar segmentation demonstrated in 1346 and is the same
numbering system used on the [place] MRI.

Intraoperative portable cross-table lateral views of the lumbar
spine.

Image #1 at 0886 hours demonstrates a posterior needle directed
toward the L4 spinous process.

Image at 1455 hours demonstrates retractors at the level of the L4
spinous process and a surgical probe directed toward the L4-L5 disc
space.

Calcified aortic atherosclerosis.
IMPRESSION: Intraoperative localization at L4-L5.
